# Patient Record
Sex: Male | Born: 1940 | Race: White | Hispanic: No | Marital: Married | Smoking: Former smoker
Health system: Southern US, Community
[De-identification: ages and names within clinical notes are randomized; demographics above are authoritative.]

## PROBLEM LIST (undated history)

## (undated) DIAGNOSIS — K219 Gastro-esophageal reflux disease without esophagitis: Secondary | ICD-10-CM

## (undated) DIAGNOSIS — N200 Calculus of kidney: Secondary | ICD-10-CM

## (undated) DIAGNOSIS — C801 Malignant (primary) neoplasm, unspecified: Secondary | ICD-10-CM

## (undated) DIAGNOSIS — H9190 Unspecified hearing loss, unspecified ear: Secondary | ICD-10-CM

## (undated) DIAGNOSIS — E7800 Pure hypercholesterolemia, unspecified: Secondary | ICD-10-CM

## (undated) DIAGNOSIS — Z950 Presence of cardiac pacemaker: Secondary | ICD-10-CM

## (undated) DIAGNOSIS — Z87442 Personal history of urinary calculi: Secondary | ICD-10-CM

## (undated) DIAGNOSIS — E119 Type 2 diabetes mellitus without complications: Secondary | ICD-10-CM

## (undated) DIAGNOSIS — I10 Essential (primary) hypertension: Secondary | ICD-10-CM

## (undated) DIAGNOSIS — J189 Pneumonia, unspecified organism: Secondary | ICD-10-CM

## (undated) DIAGNOSIS — R0600 Dyspnea, unspecified: Secondary | ICD-10-CM

## (undated) DIAGNOSIS — I219 Acute myocardial infarction, unspecified: Secondary | ICD-10-CM

## (undated) DIAGNOSIS — M1990 Unspecified osteoarthritis, unspecified site: Secondary | ICD-10-CM

## (undated) DIAGNOSIS — K760 Fatty (change of) liver, not elsewhere classified: Secondary | ICD-10-CM

## (undated) DIAGNOSIS — I2510 Atherosclerotic heart disease of native coronary artery without angina pectoris: Secondary | ICD-10-CM

## (undated) DIAGNOSIS — Z95810 Presence of automatic (implantable) cardiac defibrillator: Secondary | ICD-10-CM

## (undated) HISTORY — PX: REPLACEMENT TOTAL KNEE: SUR1224

## (undated) HISTORY — PX: INSERT / REPLACE / REMOVE PACEMAKER: SUR710

## (undated) HISTORY — DX: Gastro-esophageal reflux disease without esophagitis: K21.9

## (undated) HISTORY — DX: Type 2 diabetes mellitus without complications: E11.9

## (undated) HISTORY — PX: CORONARY ARTERY BYPASS GRAFT: SHX141

## (undated) HISTORY — PX: CARDIAC DEFIBRILLATOR PLACEMENT: SHX171

## (undated) HISTORY — DX: Pure hypercholesterolemia, unspecified: E78.00

## (undated) HISTORY — DX: Calculus of kidney: N20.0

## (undated) HISTORY — DX: Essential (primary) hypertension: I10

---

## 1966-01-05 HISTORY — PX: ANAL FISSURE REPAIR: SHX2312

## 1997-01-05 DIAGNOSIS — I219 Acute myocardial infarction, unspecified: Secondary | ICD-10-CM

## 1997-01-05 HISTORY — DX: Acute myocardial infarction, unspecified: I21.9

## 2015-01-06 HISTORY — PX: JOINT REPLACEMENT: SHX530

## 2016-05-26 ENCOUNTER — Encounter (INDEPENDENT_AMBULATORY_CARE_PROVIDER_SITE_OTHER): Payer: Self-pay | Admitting: Internal Medicine

## 2016-06-04 ENCOUNTER — Encounter (INDEPENDENT_AMBULATORY_CARE_PROVIDER_SITE_OTHER): Payer: Self-pay

## 2016-06-04 ENCOUNTER — Other Ambulatory Visit (INDEPENDENT_AMBULATORY_CARE_PROVIDER_SITE_OTHER): Payer: Self-pay | Admitting: Internal Medicine

## 2016-06-04 ENCOUNTER — Encounter (INDEPENDENT_AMBULATORY_CARE_PROVIDER_SITE_OTHER): Payer: Self-pay | Admitting: *Deleted

## 2016-06-04 ENCOUNTER — Ambulatory Visit (INDEPENDENT_AMBULATORY_CARE_PROVIDER_SITE_OTHER): Payer: Medicare Other | Admitting: Internal Medicine

## 2016-06-04 ENCOUNTER — Encounter (INDEPENDENT_AMBULATORY_CARE_PROVIDER_SITE_OTHER): Payer: Self-pay | Admitting: Internal Medicine

## 2016-06-04 DIAGNOSIS — K219 Gastro-esophageal reflux disease without esophagitis: Secondary | ICD-10-CM | POA: Diagnosis not present

## 2016-06-04 DIAGNOSIS — E7800 Pure hypercholesterolemia, unspecified: Secondary | ICD-10-CM | POA: Insufficient documentation

## 2016-06-04 DIAGNOSIS — E119 Type 2 diabetes mellitus without complications: Secondary | ICD-10-CM | POA: Insufficient documentation

## 2016-06-04 DIAGNOSIS — I10 Essential (primary) hypertension: Secondary | ICD-10-CM

## 2016-06-04 DIAGNOSIS — N200 Calculus of kidney: Secondary | ICD-10-CM | POA: Insufficient documentation

## 2016-06-04 HISTORY — DX: Calculus of kidney: N20.0

## 2016-06-04 HISTORY — DX: Gastro-esophageal reflux disease without esophagitis: K21.9

## 2016-06-04 HISTORY — DX: Essential (primary) hypertension: I10

## 2016-06-04 HISTORY — DX: Type 2 diabetes mellitus without complications: E11.9

## 2016-06-04 NOTE — Patient Instructions (Signed)
EGD. The risks of bleeding, perforation and infection were reviewed with patient.  

## 2016-06-04 NOTE — Progress Notes (Signed)
   Subjective:    Patient ID: Michael Key, male    DOB: Nov 10, 1940, 76 y.o.   MRN: [id number]  HPI Referred by Dr. Harmon Pier for reflux. Maintained on Protonix and Zantac. He says when he eats, three hours later he belches his food back up. He says he will belch the liquids up mainly. Symptoms for a long time.  No weight loss.  His symptoms occur every day.  He has a BM x 1 a day. No melena. Acid reflux controlled with Zantac and Protonix.  No dysphagia. Sometimes colas burns his esophagus.  Hx significant for DM, hypertension, cholesterol, and ischemic cardiomyopathy. , status post single chamber ICD   Last colonoscopy in February of 2016 by Dr. Earley Brooke Review of Systems Past Medical History:  Diagnosis Date  . Diabetes ([place]) 06/04/2016  . Essential hypertension, benign 06/04/2016  . GERD (gastroesophageal reflux disease) 06/04/2016  . Kidney stones 06/04/2016    Past Surgical History:  Procedure Laterality Date  . CARDIAC DEFIBRILLATOR PLACEMENT    . CORONARY ARTERY BYPASS GRAFT     04/2015    No Known Allergies  No current outpatient prescriptions on file prior to visit.   No current facility-administered medications on file prior to visit.    Current Outpatient Prescriptions  Medication Sig Dispense Refill  . acetaminophen (TYLENOL) 500 MG chewable tablet Chew 500 mg by mouth every 6 (six) hours as needed for pain.    Marland Kitchen aspirin EC 81 MG tablet Take 81 mg by mouth daily.    . carvedilol (COREG) 6.25 MG tablet Take 6.25 mg by mouth 2 (two) times daily with a meal.    . clopidogrel (PLAVIX) 75 MG tablet Take 75 mg by mouth daily.    . insulin glargine (LANTUS) 100 UNIT/ML injection Inject 45 Units into the skin 2 (two) times daily.    . insulin lispro (HUMALOG) 100 UNIT/ML injection Inject into the skin 3 (three) times daily before meals. Sliding scale 5010 units    . lisinopril (PRINIVIL,ZESTRIL) 10 MG tablet Take 10 mg by mouth daily.    . pantoprazole (PROTONIX) 40 MG  tablet Take 40 mg by mouth daily.    . ranitidine (ZANTAC) 150 MG capsule Take 150 mg by mouth 2 (two) times daily.    . rosuvastatin (CRESTOR) 20 MG tablet Take 20 mg by mouth daily.    . sitaGLIPtin-metformin (JANUMET) 50-1000 MG tablet Take 1 tablet by mouth 2 (two) times daily with a meal.    . tamsulosin (FLOMAX) 0.4 MG CAPS capsule Take 0.4 mg by mouth.     No current facility-administered medications for this visit.         Objective:   Physical Exam Blood pressure 130/60, pulse 64, temperature 97.7 F (36.5 C), height 6' (1.829 m), weight 218 lb 8 oz (99.1 kg).  Alert and oriented. Skin warm and dry. Oral mucosa is moist.   . Sclera anicteric, conjunctivae is pink. Thyroid not enlarged. No cervical lymphadenopathy. Lungs clear. Heart regular rate and rhythm.  Abdomen is soft. Bowel sounds are positive. No hepatomegaly. No abdominal masses felt. No tenderness.  No edema to lower extremities.  .        Assessment & Plan:  GERD. EGD. PUD needs to be ruled out. ? Hiatal hernia. The risks of bleeding, perforation and infection were reviewed with patient.

## 2016-06-05 ENCOUNTER — Encounter (INDEPENDENT_AMBULATORY_CARE_PROVIDER_SITE_OTHER): Payer: Self-pay

## 2016-06-05 ENCOUNTER — Telehealth (INDEPENDENT_AMBULATORY_CARE_PROVIDER_SITE_OTHER): Payer: Self-pay | Admitting: *Deleted

## 2016-06-05 NOTE — Telephone Encounter (Signed)
FYI: Per Dr Loel Lofty patient can hold Plavix 5 days prior to EGD sch'd 06/22/16 but he needs to continue his ASA -- patient's wife aware

## 2016-06-11 NOTE — Telephone Encounter (Signed)
Agree with continuing low-dose aspirin while Plavix on hold.

## 2016-06-15 ENCOUNTER — Encounter (INDEPENDENT_AMBULATORY_CARE_PROVIDER_SITE_OTHER): Payer: Self-pay

## 2016-06-22 ENCOUNTER — Encounter (HOSPITAL_COMMUNITY)
Admission: RE | Disposition: A | Discharge status: Home / Self Care | Payer: Self-pay | Source: Ambulatory Visit | Attending: Internal Medicine

## 2016-06-22 ENCOUNTER — Encounter (HOSPITAL_COMMUNITY): Payer: Self-pay | Admitting: *Deleted

## 2016-06-22 ENCOUNTER — Ambulatory Visit (HOSPITAL_COMMUNITY)
Admission: RE | Admit: 2016-06-22 | Discharge: 2016-06-22 | Disposition: A | Discharge status: Home / Self Care | Payer: Medicare Other | Source: Ambulatory Visit | Attending: Internal Medicine | Admitting: Internal Medicine

## 2016-06-22 DIAGNOSIS — Z7902 Long term (current) use of antithrombotics/antiplatelets: Secondary | ICD-10-CM | POA: Diagnosis not present

## 2016-06-22 DIAGNOSIS — Z950 Presence of cardiac pacemaker: Secondary | ICD-10-CM | POA: Insufficient documentation

## 2016-06-22 DIAGNOSIS — I10 Essential (primary) hypertension: Secondary | ICD-10-CM | POA: Diagnosis not present

## 2016-06-22 DIAGNOSIS — K317 Polyp of stomach and duodenum: Secondary | ICD-10-CM | POA: Insufficient documentation

## 2016-06-22 DIAGNOSIS — I2510 Atherosclerotic heart disease of native coronary artery without angina pectoris: Secondary | ICD-10-CM | POA: Diagnosis not present

## 2016-06-22 DIAGNOSIS — Z7982 Long term (current) use of aspirin: Secondary | ICD-10-CM | POA: Insufficient documentation

## 2016-06-22 DIAGNOSIS — E7800 Pure hypercholesterolemia, unspecified: Secondary | ICD-10-CM | POA: Insufficient documentation

## 2016-06-22 DIAGNOSIS — K219 Gastro-esophageal reflux disease without esophagitis: Secondary | ICD-10-CM | POA: Insufficient documentation

## 2016-06-22 DIAGNOSIS — Z794 Long term (current) use of insulin: Secondary | ICD-10-CM | POA: Diagnosis not present

## 2016-06-22 DIAGNOSIS — Z87891 Personal history of nicotine dependence: Secondary | ICD-10-CM | POA: Diagnosis not present

## 2016-06-22 DIAGNOSIS — K31819 Angiodysplasia of stomach and duodenum without bleeding: Secondary | ICD-10-CM | POA: Insufficient documentation

## 2016-06-22 DIAGNOSIS — I252 Old myocardial infarction: Secondary | ICD-10-CM | POA: Diagnosis not present

## 2016-06-22 DIAGNOSIS — R142 Eructation: Secondary | ICD-10-CM

## 2016-06-22 DIAGNOSIS — E119 Type 2 diabetes mellitus without complications: Secondary | ICD-10-CM | POA: Insufficient documentation

## 2016-06-22 DIAGNOSIS — Z951 Presence of aortocoronary bypass graft: Secondary | ICD-10-CM | POA: Diagnosis not present

## 2016-06-22 HISTORY — DX: Atherosclerotic heart disease of native coronary artery without angina pectoris: I25.10

## 2016-06-22 HISTORY — DX: Presence of automatic (implantable) cardiac defibrillator: Z95.810

## 2016-06-22 HISTORY — DX: Acute myocardial infarction, unspecified: I21.9

## 2016-06-22 HISTORY — PX: ESOPHAGOGASTRODUODENOSCOPY: SHX5428

## 2016-06-22 HISTORY — DX: Dyspnea, unspecified: R06.00

## 2016-06-22 HISTORY — PX: BIOPSY: SHX5522

## 2016-06-22 HISTORY — DX: Presence of cardiac pacemaker: Z95.0

## 2016-06-22 LAB — GLUCOSE, CAPILLARY
Glucose-Capillary: 146 mg/dL — ABNORMAL HIGH (ref 65–99)
Glucose-Capillary: 117 mg/dL — ABNORMAL HIGH (ref 65–99)

## 2016-06-22 SURGERY — EGD (ESOPHAGOGASTRODUODENOSCOPY)
Anesthesia: Moderate Sedation

## 2016-06-22 MED ORDER — MEPERIDINE HCL 50 MG/ML IJ SOLN
INTRAMUSCULAR | Status: AC
  Filled 2016-06-22: qty 1

## 2016-06-22 MED ORDER — MEPERIDINE HCL 50 MG/ML IJ SOLN
INTRAMUSCULAR | Status: DC | PRN
  Administered 2016-06-22 (×2): 25 mg via INTRAVENOUS

## 2016-06-22 MED ORDER — STERILE WATER FOR IRRIGATION IR SOLN
Status: DC | PRN
  Administered 2016-06-22: 08:00:00

## 2016-06-22 MED ORDER — MIDAZOLAM HCL 5 MG/5ML IJ SOLN
INTRAMUSCULAR | Status: AC
  Filled 2016-06-22: qty 10

## 2016-06-22 MED ORDER — SODIUM CHLORIDE 0.9 % IV SOLN
INTRAVENOUS | Status: DC
  Administered 2016-06-22: 1000 mL via INTRAVENOUS

## 2016-06-22 MED ORDER — LIDOCAINE VISCOUS 2 % MT SOLN
OROMUCOSAL | Status: AC
  Filled 2016-06-22: qty 15

## 2016-06-22 MED ORDER — LIDOCAINE VISCOUS 2 % MT SOLN
OROMUCOSAL | Status: DC | PRN
  Administered 2016-06-22: 4 mL via OROMUCOSAL

## 2016-06-22 MED ORDER — MIDAZOLAM HCL 5 MG/5ML IJ SOLN
INTRAMUSCULAR | Status: DC | PRN
  Administered 2016-06-22 (×2): 2 mg via INTRAVENOUS
  Administered 2016-06-22: 1 mg via INTRAVENOUS

## 2016-06-22 NOTE — Discharge Instructions (Signed)
Resume Plavix/clopidogrel on 06/23/2016. Take ranitidine or Zantac at bedtime and not twice daily. Resume other medications and diet as before. No driving for 24 hours. Physician will call with biopsy results. Solid-phase gastric emptying study to be scheduled. Office will call.  Esophagogastroduodenoscopy, Care After Refer to this sheet in the next few weeks. These instructions provide you with information about caring for yourself after your procedure. Your health care provider may also give you more specific instructions. Your treatment has been planned according to current medical practices, but problems sometimes occur. Call your health care provider if you have any problems or questions after your procedure. What can I expect after the procedure? After the procedure, it is common to have:  A sore throat.  Nausea.  Bloating.  Dizziness.  Fatigue.  Follow these instructions at home:  Do not eat or drink anything until the numbing medicine (local anesthetic) has worn off and your gag reflex has returned. You will know that the local anesthetic has worn off when you can swallow comfortably.  Do not drive for 24 hours if you received a medicine to help you relax (sedative).  If your health care provider took a tissue sample for testing during the procedure, make sure to get your test results. This is your responsibility. Ask your health care provider or the department performing the test when your results will be ready.  Keep all follow-up visits as told by your health care provider. This is important. Contact a health care provider if:  You cannot stop coughing.  You are not urinating.  You are urinating less than usual. Get help right away if:  You have trouble swallowing.  You cannot eat or drink.  You have throat or chest pain that gets worse.  You are dizzy or light-headed.  You faint.  You have nausea or vomiting.  You have chills.  You have a  fever.  You have severe abdominal pain.  You have black, tarry, or bloody stools. This information is not intended to replace advice given to you by your health care provider. Make sure you discuss any questions you have with your health care provider. Document Released: 12/09/2011 Document Revised: 05/30/2015 Document Reviewed: 11/15/2014 Elsevier Interactive Patient Education  2018 [employer].  Ranitidine tablets or capsules What is this medicine? RANITIDINE (ra NYE te deen) is a type of antihistamine that blocks the release of stomach acid. It is used to treat stomach or intestinal ulcers. It can relieve ulcer pain and discomfort, and the heartburn from acid reflux. This medicine may be used for other purposes; ask your health care provider or pharmacist if you have questions. COMMON BRAND NAME(S): Acid Reducer, Ranitidine, Taladine, Wal-Zan, Zantac, Zantac 150, Zantac 75 What should I tell my health care provider before I take this medicine? They need to know if you have any of these conditions: -kidney disease -liver disease -porphyria -an unusual or allergic reaction to ranitidine, other medicines, foods, dyes, or preservatives -pregnant or trying to get pregnant -breast-feeding How should I use this medicine? Take this medicine by mouth with a glass of water. Follow the directions on the prescription label. If you only take this medicine once a day, take it at bedtime. Take your medicine at regular intervals. Do not take your medicine more often than directed. Do not stop taking except on your doctor's advice. Talk to your pediatrician regarding the use of this medicine in children. Special care may be needed. Overdosage: If you think you have taken  too much of this medicine contact a poison control center or emergency room at once. NOTE: This medicine is only for you. Do not share this medicine with others. What if I miss a dose? If you miss a dose, take it as soon as you can.  If it is almost time for your next dose, take only that dose. Do not take double or extra doses. What may interact with this medicine? -atazanavir -delavirdine -gefitinib -glipizide -ketoconazole -midazolam -procainamide -propantheline -triazolam -warfarin This list may not describe all possible interactions. Give your health care provider a list of all the medicines, herbs, non-prescription drugs, or dietary supplements you use. Also tell them if you smoke, drink alcohol, or use illegal drugs. Some items may interact with your medicine. What should I watch for while using this medicine? Tell your doctor or health care professional if your condition does not start to get better or gets worse. You may need to take this medicine for several days as prescribed before your symptoms get better. Finish the full course of tablets prescribed, even if you feel better. Do not smoke cigarettes or drink alcohol. These increase irritation in your stomach and can lengthen the time it will take for ulcers to heal. Cigarettes and alcohol can also make acid reflux or heartburn worse. If you get black, tarry stools or vomit up what looks like coffee grounds, call your doctor or health care professional at once. You may have a bleeding ulcer. What side effects may I notice from receiving this medicine? Side effects that you should report to your doctor or health care professional as soon as possible: -agitation, nervousness, depression, hallucinations -allergic reactions like skin rash, itching or hives, swelling of the face, lips, or tongue -breast enlargement in both males and females -breathing problems -redness, blistering, peeling or loosening of the skin, including inside the mouth -unusual bleeding or bruising -unusually weak or tired -vomiting -yellowing of the skin or eyes Side effects that usually do not require medical attention (report to your doctor or health care professional if they continue  or are bothersome): -constipation or diarrhea -dizziness -headache -nausea This list may not describe all possible side effects. Call your doctor for medical advice about side effects. You may report side effects to FDA at 1-800-FDA-1088. Where should I keep my medicine? Keep out of the reach of children. Store at room temperature between 15 and 30 degrees C (59 and 86 degrees F). Protect from light and moisture. Keep container tightly closed. Throw away any unused medicine after the expiration date. NOTE: This sheet is a summary. It may not cover all possible information. If you have questions about this medicine, talk to your doctor, pharmacist, or health care provider.  2018 Elsevier/Gold Standard (2012-04-13 14:50:34)

## 2016-06-22 NOTE — Op Note (Signed)
[facility] Patient Name: Michael Key Procedure Date: 06/22/2016 7:03 AM MRN: [id number] Date of Birth: [birth year] Attending MD: Hildred Laser , MD CSN: [id number] Age: 76 Admit Type: Outpatient Procedure:                Upper GI endoscopy Indications:              Follow-up of gastro-esophageal reflux disease,                            postprandial belching Providers:                Hildred Laser, MD, Otis Peak B. Sharon Seller, RN, Aram Candela Referring MD:             Cherly Anderson. Pomposini, MD Medicines:                Lidocaine spray, Meperidine 50 mg IV, Midazolam 5                            mg IV Complications:            No immediate complications. Estimated Blood Loss:     Estimated blood loss was minimal. Procedure:                Pre-Anesthesia Assessment:                           - Prior to the procedure, a History and Physical                            was performed, and patient medications and                            allergies were reviewed. The patient's tolerance of                            previous anesthesia was also reviewed. The risks                            and benefits of the procedure and the sedation                            options and risks were discussed with the patient.                            All questions were answered, and informed consent                            was obtained. Prior Anticoagulants: The patient                            last took aspirin 1 day and Plavix (clopidogrel) 5  days prior to the procedure. ASA Grade Assessment:                            III - A patient with severe systemic disease. After                            reviewing the risks and benefits, the patient was                            deemed in satisfactory condition to undergo the                            procedure.                           After obtaining informed consent, the endoscope was                           passed under direct vision. Throughout the                            procedure, the patient's blood pressure, pulse, and                            oxygen saturations were monitored continuously. The                            EG-[id number] ([id number]) scope was introduced through the                            mouth, and advanced to the second part of duodenum.                            The upper GI endoscopy was accomplished without                            difficulty. The patient tolerated the procedure                            well. Scope In: 7:45:10 AM Scope Out: 7:53:44 AM Total Procedure Duration: 0 hours 8 minutes 34 seconds  Findings:      Two small cysts located along the right wall of hypopharynx.      The examined esophagus was normal.      The Z-line was regular and was found 40 cm from the incisors.      Multiple 3 to 8 mm pedunculated and sessile polyps were found in the       gastric body. Four polyps were biopsied for histology. The pathology       specimen was placed into Bottle Number 1.      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      The exam of the stomach was otherwise normal.      The duodenal bulb and  second portion of the duodenum were normal. Impression:               - Two small cysts located along the rightwall of                            hypopharynx.                           - Normal esophagus.                           - Z-line regular, 40 cm from the incisors.                           - Multiple gastric polyps. Biopsied.                           - Gastric antral vascular ectasia without bleeding.                           - Normal duodenal bulb and second portion of the                            duodenum. Moderate Sedation:      Moderate (conscious) sedation was administered by the endoscopy nurse       and supervised by the  endoscopist. The following parameters were       monitored: oxygen saturation, heart rate, blood pressure, CO2       capnography and response to care. Total physician intraservice time was       13 minutes. Recommendation:           - Patient has a contact number available for                            emergencies. The signs and symptoms of potential                            delayed complications were discussed with the                            patient. Return to normal activities tomorrow.                            Written discharge instructions were provided to the                            patient.                           - Patient has a contact number available for                            emergencies. The signs and symptoms of potential                            delayed complications were discussed with  the                            patient. Return to normal activities tomorrow.                            Written discharge instructions were provided to the                            patient.                           - Resume previous diet today.                           - Continue present medications.                           - Resume Plavix (clopidogrel) at prior dose                            tomorrow.                           - Await pathology results. Procedure Code(s):        --- Professional ---                           [phone], Esophagogastroduodenoscopy, flexible,                            transoral; with biopsy, single or multiple                           99152, Moderate sedation services provided by the                            same physician or other qualified health care                            professional performing the diagnostic or                            therapeutic service that the sedation supports,                            requiring the presence of an independent trained                            observer to assist in the monitoring  of the                            patient's level of consciousness and physiological                            status; initial 15 minutes of intraservice time,  patient age 76 years or older Diagnosis Code(s):        --- Professional ---                           K31.7, Polyp of stomach and duodenum                           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 All rights reserved. The codes documented in this report are preliminary and upon coder review may  be revised to meet current compliance requirements. Hildred Laser, MD Hildred Laser, MD 06/22/2016 8:11:13 AM This report has been signed electronically. Number of Addenda: 0

## 2016-06-22 NOTE — H&P (Signed)
Michael Key is an 76 y.o. male.   Chief Complaint: Patient is here for EGD. HPI: Patient is 76 year old Caucasian male who has several year history of GERD and has been on PPI. Patient states ever since his PPI was changed because of high co-pay he's been having problems. He belches for 3-4 hours after each meal. However he has not experienced nausea vomiting abdominal pain. He states heartburns well controlled with therapy. He has good appetite and denies weight loss or melena. He states he is never undergone EGD before. Patient has been off Plavix for 5 days. He remains on aspirin. He has been diabetic for 19 years.  Past Medical History:  Diagnosis Date  . AICD (automatic cardioverter/defibrillator) present   . Coronary artery disease   . Diabetes ([place]) 06/04/2016  . Dyspnea   . Essential hypertension, benign 06/04/2016  . GERD (gastroesophageal reflux disease) 06/04/2016  . High cholesterol   . Kidney stones 06/04/2016  . Myocardial infarction ([place]) 1999  . Presence of permanent cardiac pacemaker     Past Surgical History:  Procedure Laterality Date  . ANAL FISSURE REPAIR  1968  . CARDIAC DEFIBRILLATOR PLACEMENT    . CORONARY ARTERY BYPASS GRAFT     04/2015  . INSERT / REPLACE / REMOVE PACEMAKER    . JOINT REPLACEMENT Left 2017  . REPLACEMENT TOTAL KNEE     left 2017    History reviewed. No pertinent family history. Social History:  reports that he quit smoking about 19 years ago. He has never used smokeless tobacco. He reports that he does not drink alcohol or use drugs.  Allergies: No Known Allergies  Medications Prior to Admission  Medication Sig Dispense Refill  . acetaminophen (TYLENOL) 500 MG chewable tablet Chew 500 mg by mouth every 6 (six) hours as needed for pain.    Marland Kitchen aspirin EC 81 MG tablet Take 81 mg by mouth daily.    . carvedilol (COREG) 6.25 MG tablet Take 6.25 mg by mouth 2 (two) times daily with a meal.    . clopidogrel (PLAVIX) 75 MG tablet Take 75 mg by  mouth daily.    . insulin glargine (LANTUS) 100 UNIT/ML injection Inject 45 Units into the skin 2 (two) times daily.    . insulin lispro (HUMALOG) 100 UNIT/ML injection Inject into the skin 3 (three) times daily before meals. Sliding scale 5010 units    . lisinopril (PRINIVIL,ZESTRIL) 10 MG tablet Take 10 mg by mouth daily.    . pantoprazole (PROTONIX) 40 MG tablet Take 40 mg by mouth daily.    . ranitidine (ZANTAC) 150 MG capsule Take 150 mg by mouth 2 (two) times daily.    . rosuvastatin (CRESTOR) 20 MG tablet Take 20 mg by mouth daily.    . sitaGLIPtin-metformin (JANUMET) 50-1000 MG tablet Take 1 tablet by mouth 2 (two) times daily with a meal.    . tamsulosin (FLOMAX) 0.4 MG CAPS capsule Take 0.4 mg by mouth daily after supper.       Results for orders placed or performed during the hospital encounter of 06/22/16 (from the past 48 hour(s))  Glucose, capillary     Status: Abnormal   Collection Time: 06/22/16  7:02 AM  Result Value Ref Range   Glucose-Capillary 146 (H) 65 - 99 mg/dL   No results found.  ROS  Blood pressure 125/65, pulse 78, temperature 97.8 F (36.6 C), temperature source Oral, resp. rate 17, height 6' (1.829 m), weight 214 lb (97.1 kg), SpO2 97 %.  Physical Exam  Constitutional: He appears well-developed and well-nourished.  HENT:  Mouth/Throat: Oropharynx is clear and moist.  Eyes: Conjunctivae are normal. No scleral icterus.  Neck: No thyromegaly present.  Cardiovascular: Normal rate, regular rhythm and normal heart sounds.   No murmur heard. Respiratory: Effort normal and breath sounds normal.  Patient has pacemaker in left pectoral region.  GI:  Abdomen is full but soft and nontender without organomegaly or masses.  Musculoskeletal: He exhibits no edema.  Lymphadenopathy:    He has no cervical adenopathy.  Neurological: He is alert.  Skin: Skin is warm and dry.     Assessment/Plan Chronic GERD. Persistent postprandial belching. Diagnostic  EGD.  Hildred Laser, MD 06/22/2016, 7:34 AM

## 2016-06-24 ENCOUNTER — Other Ambulatory Visit (INDEPENDENT_AMBULATORY_CARE_PROVIDER_SITE_OTHER): Payer: Self-pay | Admitting: *Deleted

## 2016-06-24 ENCOUNTER — Telehealth (INDEPENDENT_AMBULATORY_CARE_PROVIDER_SITE_OTHER): Payer: Self-pay | Admitting: *Deleted

## 2016-06-24 ENCOUNTER — Encounter (INDEPENDENT_AMBULATORY_CARE_PROVIDER_SITE_OTHER): Payer: Self-pay | Admitting: *Deleted

## 2016-06-24 NOTE — Progress Notes (Signed)
.  J

## 2016-06-24 NOTE — Telephone Encounter (Signed)
Michael Key from [place] called--Dr. Laural Golden would like patient to have solid phase GES   Persistent postprandial belching. S/P EGD   Tammy can you put order in please and Lelon Frohlich can schedule upon return

## 2016-06-25 ENCOUNTER — Encounter (HOSPITAL_COMMUNITY): Payer: Self-pay | Admitting: Internal Medicine

## 2016-06-25 NOTE — Telephone Encounter (Signed)
Awaiting for PA to be done. Then order to be placed.

## 2016-06-29 ENCOUNTER — Other Ambulatory Visit (INDEPENDENT_AMBULATORY_CARE_PROVIDER_SITE_OTHER): Payer: Self-pay | Admitting: Internal Medicine

## 2016-06-29 DIAGNOSIS — R142 Eructation: Secondary | ICD-10-CM

## 2016-06-29 NOTE — Telephone Encounter (Signed)
GES sh'd 07/14/16 at 800 am (745), npo after midnight, no stomach meds after midnight, left detailed message for patient

## 2016-06-29 NOTE — Telephone Encounter (Signed)
Michael Key - this may need to have PA completed before order can be placed.

## 2016-07-14 ENCOUNTER — Encounter (HOSPITAL_COMMUNITY): Payer: Self-pay

## 2016-07-14 ENCOUNTER — Encounter (HOSPITAL_COMMUNITY)
Admission: RE | Admit: 2016-07-14 | Discharge: 2016-07-14 | Disposition: A | Discharge status: Home / Self Care | Payer: Medicare Other | Source: Ambulatory Visit | Attending: Internal Medicine | Admitting: Internal Medicine

## 2016-07-14 DIAGNOSIS — R142 Eructation: Secondary | ICD-10-CM | POA: Diagnosis present

## 2016-07-14 MED ORDER — TECHNETIUM TC 99M SULFUR COLLOID
2.0000 | Freq: Once | INTRAVENOUS | Status: AC | PRN
  Administered 2016-07-14: 2.1 via ORAL

## 2016-07-20 ENCOUNTER — Other Ambulatory Visit (INDEPENDENT_AMBULATORY_CARE_PROVIDER_SITE_OTHER): Payer: Self-pay | Admitting: Internal Medicine

## 2016-07-20 DIAGNOSIS — R142 Eructation: Secondary | ICD-10-CM

## 2016-07-23 ENCOUNTER — Ambulatory Visit (HOSPITAL_COMMUNITY): Payer: Medicare Other

## 2016-07-27 ENCOUNTER — Ambulatory Visit (HOSPITAL_COMMUNITY)
Admission: RE | Admit: 2016-07-27 | Discharge: 2016-07-27 | Disposition: A | Discharge status: Home / Self Care | Payer: Medicare Other | Source: Ambulatory Visit | Attending: Internal Medicine | Admitting: Internal Medicine

## 2016-07-27 DIAGNOSIS — R142 Eructation: Secondary | ICD-10-CM | POA: Insufficient documentation

## 2016-08-06 ENCOUNTER — Other Ambulatory Visit (INDEPENDENT_AMBULATORY_CARE_PROVIDER_SITE_OTHER): Payer: Self-pay | Admitting: Internal Medicine

## 2016-08-06 MED ORDER — METRONIDAZOLE 250 MG PO TABS: 250.0000 mg | ORAL_TABLET | Freq: Three times a day (TID) | ORAL | 0 refills | Status: DC

## 2017-10-04 ENCOUNTER — Other Ambulatory Visit: Payer: Self-pay | Admitting: Urology

## 2017-10-04 DIAGNOSIS — R310 Gross hematuria: Secondary | ICD-10-CM

## 2017-10-18 ENCOUNTER — Other Ambulatory Visit: Payer: Self-pay | Admitting: Urology

## 2017-10-20 NOTE — Patient Instructions (Signed)
Michael Key  10/20/2017     @PREFPERIOPPHARMACY @   Your procedure is scheduled on 10/26/2017  Report to [facility] at 615 A.M.  Call this number if you have problems the morning of surgery:  [phone]   Remember:  Do not eat or drink after midnight.      Take these medicines the morning of surgery with A SIP OF WATER Coreg, Xyzal, Lisinopril, Protonix, Zantac, Flomax              NO Janumet or AM insulin - Take 1/2 evening insulin dose evening prior.   Do not wear jewelry, make-up or nail polish.  Do not wear lotions, powders, or perfumes, or deodorant.  Do not shave 48 hours prior to surgery.  Men may shave face and neck.  Do not bring valuables to the hospital.  [facility] is not responsible for any belongings or valuables.  Contacts, dentures or bridgework may not be worn into surgery.  Leave your suitcase in the car.  After surgery it may be brought to your room.  For patients admitted to the hospital, discharge time will be determined by your treatment team.  Patients discharged the day of surgery will not be allowed to drive home.    Please read over the following fact sheets that you were given. Surgical Site Infection Prevention and Anesthesia Post-op Instructions     PATIENT INSTRUCTIONS POST-ANESTHESIA  IMMEDIATELY FOLLOWING SURGERY:  Do not drive or operate machinery for the first twenty four hours after surgery.  Do not make any important decisions for twenty four hours after surgery or while taking narcotic pain medications or sedatives.  If you develop intractable nausea and vomiting or a severe headache please notify your doctor immediately.  FOLLOW-UP:  Please make an appointment with your surgeon as instructed. You do not need to follow up with anesthesia unless specifically instructed to do so.  WOUND CARE INSTRUCTIONS (if applicable):  Keep a dry clean dressing on the anesthesia/puncture wound site if there is drainage.  Once the wound has quit  draining you may leave it open to air.  Generally you should leave the bandage intact for twenty four hours unless there is drainage.  If the epidural site drains for more than 36-48 hours please call the anesthesia department.  QUESTIONS?:  Please feel free to call your physician or the hospital operator if you have any questions, and they will be happy to assist you.      Transurethral Resection of Bladder Tumor Transurethral resection of a bladder tumor is the removal (resection) of a cancerous growth (tumor) on the inside wall of the bladder. The bladder is the organ that holds urine. The tumor is removed through the tube that carries urine out of the body (urethra). In a transurethral resection, a thin telescope with a light, a tiny camera, and an electric cutting edge (resectoscope) is passed through the urethra. In men, the opening of the urethra is at the end of the penis. In women, it is just above the vaginal opening. Tell a health care provider about:  Any allergies you have.  All medicines you are taking, including vitamins, herbs, eye drops, creams, and over-the-counter medicines.  Any problems you or family members have had with anesthetic medicines.  Any blood disorders you have.  Any surgeries you have had.  Any medical conditions you have.  Any recent urinary tract infections you have had.  Whether you are pregnant or may be pregnant. What are  the risks? Generally, this is a safe procedure. However, problems may occur, including:  Infection.  Bleeding.  Allergic reactions to medicines.  Damage to other structures or organs, such as: ? The urethra. ? The tubes that drain urine from the kidneys into the bladder (ureters).  Pain and burning during urination.  Difficulty urinating due to partial blockage of the urethra.  Inability to urinate (urinary retention).  What happens before the procedure?  Follow instructions from your health care provider about  eating and drinking restrictions.  Ask your health care provider about: ? Changing or stopping your regular medicines. This is especially important if you are taking diabetes medicines or blood thinners. ? Taking medicines such as aspirin and ibuprofen. These medicines can thin your blood. Do not take these medicines before your procedure if your health care provider instructs you not to.  You may have a physical exam.  You may have tests, including: ? Blood tests. ? Urine tests. ? Electrocardiogram (ECG). This test measures the electrical activity of the heart.  You may be given antibiotic medicine to help prevent infection.  Ask your health care provider how your surgical site will be marked or identified.  Plan to have someone take you home after the procedure. What happens during the procedure?  To reduce your risk of infection: ? Your health care team will wash or sanitize their hands. ? Your skin will be washed with soap.  An IV tube will be inserted into one of your veins.  You will be given one or more of the following: ? A medicine to help you relax (sedative). ? A medicine to make you fall asleep (general anesthetic). ? A medicine that is injected into your spine to numb the area below and slightly above the injection site (spinal anesthetic).  Your legs will be placed in foot rests (stirrups) so that your legs are apart and your knees are bent.  The resectoscope will be passed through your urethra and into your bladder.  The part of your bladder that is affected by the tumor will be resected using the cutting edge of the resectoscope.  The resectoscope will be removed.  A thin, flexible tube (catheter) will be passed through your urethra and into your bladder. The catheter will drain urine into a bag outside of your body. ? Fluid may be passed through the catheter to keep the catheter open. The procedure may vary among health care providers and hospitals. What  happens after the procedure?  Your blood pressure, heart rate, breathing rate, and blood oxygen level will be monitored often until the medicines you were given have worn off.  You may continue to receive fluids and medicines through an IV tube.  You will have some pain. Pain medicine will be available to help you.  You will have a catheter draining your urine. ? You will have blood in your urine. Your catheter may be kept in until your urine is clear. ? Your urinary drainage will be monitored. If necessary, your bladder may be rinsed out (irrigated) by passing fluid through your catheter.  You will be encouraged to walk around as soon as possible.  You may have to wear compression stockings. These stockings help to prevent blood clots and reduce swelling in your legs.  Do not drive for 24 hours if you received a sedative. This information is not intended to replace advice given to you by your health care provider. Make sure you discuss any questions you have with  your health care provider. Document Released: 10/18/2008 Document Revised: 08/25/2015 Document Reviewed: 09/13/2014 Elsevier Interactive Patient Education  2018 [employer].

## 2017-10-21 ENCOUNTER — Encounter (HOSPITAL_COMMUNITY): Payer: Self-pay

## 2017-10-21 ENCOUNTER — Encounter (HOSPITAL_COMMUNITY)
Admission: RE | Admit: 2017-10-21 | Discharge: 2017-10-21 | Disposition: A | Discharge status: Home / Self Care | Payer: Medicare Other | Source: Ambulatory Visit | Attending: Urology | Admitting: Urology

## 2017-10-21 ENCOUNTER — Other Ambulatory Visit: Payer: Self-pay

## 2017-10-21 DIAGNOSIS — E119 Type 2 diabetes mellitus without complications: Secondary | ICD-10-CM | POA: Diagnosis not present

## 2017-10-21 DIAGNOSIS — Z01818 Encounter for other preprocedural examination: Secondary | ICD-10-CM | POA: Insufficient documentation

## 2017-10-21 HISTORY — DX: Personal history of urinary calculi: Z87.442

## 2017-10-21 HISTORY — DX: Unspecified hearing loss, unspecified ear: H91.90

## 2017-10-21 HISTORY — DX: Unspecified osteoarthritis, unspecified site: M19.90

## 2017-10-21 LAB — CBC
HEMATOCRIT: 40.4 % (ref 39.0–52.0)
HEMOGLOBIN: 13.6 g/dL (ref 13.0–17.0)
MCH: 30 pg (ref 26.0–34.0)
MCHC: 33.7 g/dL (ref 30.0–36.0)
MCV: 89.2 fL (ref 80.0–100.0)
Platelets: 197 10*3/uL (ref 150–400)
RBC: 4.53 MIL/uL (ref 4.22–5.81)
RDW: 13.1 % (ref 11.5–15.5)
WBC: 9.3 10*3/uL (ref 4.0–10.5)
nRBC: 0 % (ref 0.0–0.2)

## 2017-10-21 LAB — BASIC METABOLIC PANEL
ANION GAP: 8 (ref 5–15)
BUN: 14 mg/dL (ref 8–23)
CHLORIDE: 105 mmol/L (ref 98–111)
CO2: 26 mmol/L (ref 22–32)
Calcium: 9 mg/dL (ref 8.9–10.3)
Creatinine, Ser: 0.93 mg/dL (ref 0.61–1.24)
GFR calc non Af Amer: >60 mL/min (ref >60)
Glucose, Bld: 121 mg/dL — ABNORMAL HIGH (ref 70–99)
Potassium: 3.8 mmol/L (ref 3.5–5.1)
Sodium: 139 mmol/L (ref 135–145)

## 2017-10-21 LAB — HEMOGLOBIN A1C
HEMOGLOBIN A1C: 6.9 % — AB (ref 4.8–5.6)
MEAN PLASMA GLUCOSE: 151.33 mg/dL

## 2017-10-21 LAB — GLUCOSE, CAPILLARY: GLUCOSE-CAPILLARY: 126 mg/dL — AB (ref 70–99)

## 2017-10-22 NOTE — Pre-Procedure Instructions (Signed)
HgbA1C routed to PCP. 

## 2017-10-25 MED ORDER — GEMCITABINE CHEMO FOR BLADDER INSTILLATION 2000 MG
2000.0000 mg | Freq: Once | INTRAVENOUS | Status: DC
  Filled 2017-10-25 (×2): qty 52.6

## 2017-10-25 NOTE — Progress Notes (Signed)
Attempted to obtain ICD management perioperative prescription from Dr Veleta Miners via fax and multiple phone calls.  Evonnie Pat Clinical nurse Specialist for Surgical services notified and  "stated that patients having procedures below umbilicus need no intervention".  Tomi Bamberger the [employer] Rep stated "no intervention needed for below the umbilicus".  Dr. Hilaria Ota notified we had not received the order from Dr Loel Lofty.

## 2017-10-26 ENCOUNTER — Encounter (HOSPITAL_COMMUNITY)
Admission: RE | Disposition: A | Discharge status: Home / Self Care | Payer: Self-pay | Source: Ambulatory Visit | Attending: Urology

## 2017-10-26 ENCOUNTER — Encounter (HOSPITAL_COMMUNITY): Payer: Self-pay | Admitting: *Deleted

## 2017-10-26 ENCOUNTER — Ambulatory Visit (HOSPITAL_COMMUNITY): Payer: Medicare Other | Admitting: Anesthesiology

## 2017-10-26 ENCOUNTER — Ambulatory Visit (HOSPITAL_COMMUNITY)
Admission: RE | Admit: 2017-10-26 | Discharge: 2017-10-26 | Disposition: A | Discharge status: Home / Self Care | Payer: Medicare Other | Source: Ambulatory Visit | Attending: Urology | Admitting: Urology

## 2017-10-26 DIAGNOSIS — I2510 Atherosclerotic heart disease of native coronary artery without angina pectoris: Secondary | ICD-10-CM | POA: Insufficient documentation

## 2017-10-26 DIAGNOSIS — Z79899 Other long term (current) drug therapy: Secondary | ICD-10-CM | POA: Diagnosis not present

## 2017-10-26 DIAGNOSIS — N3081 Other cystitis with hematuria: Secondary | ICD-10-CM | POA: Insufficient documentation

## 2017-10-26 DIAGNOSIS — Z794 Long term (current) use of insulin: Secondary | ICD-10-CM | POA: Diagnosis not present

## 2017-10-26 DIAGNOSIS — I10 Essential (primary) hypertension: Secondary | ICD-10-CM | POA: Diagnosis not present

## 2017-10-26 DIAGNOSIS — K219 Gastro-esophageal reflux disease without esophagitis: Secondary | ICD-10-CM | POA: Diagnosis not present

## 2017-10-26 DIAGNOSIS — Z95810 Presence of automatic (implantable) cardiac defibrillator: Secondary | ICD-10-CM | POA: Insufficient documentation

## 2017-10-26 DIAGNOSIS — Z96652 Presence of left artificial knee joint: Secondary | ICD-10-CM | POA: Insufficient documentation

## 2017-10-26 DIAGNOSIS — D090 Carcinoma in situ of bladder: Secondary | ICD-10-CM | POA: Diagnosis not present

## 2017-10-26 DIAGNOSIS — Z87891 Personal history of nicotine dependence: Secondary | ICD-10-CM | POA: Insufficient documentation

## 2017-10-26 DIAGNOSIS — I252 Old myocardial infarction: Secondary | ICD-10-CM | POA: Diagnosis not present

## 2017-10-26 DIAGNOSIS — Z951 Presence of aortocoronary bypass graft: Secondary | ICD-10-CM | POA: Insufficient documentation

## 2017-10-26 DIAGNOSIS — R319 Hematuria, unspecified: Secondary | ICD-10-CM | POA: Diagnosis present

## 2017-10-26 DIAGNOSIS — D494 Neoplasm of unspecified behavior of bladder: Secondary | ICD-10-CM

## 2017-10-26 DIAGNOSIS — E109 Type 1 diabetes mellitus without complications: Secondary | ICD-10-CM | POA: Diagnosis not present

## 2017-10-26 HISTORY — PX: TRANSURETHRAL RESECTION OF BLADDER TUMOR: SHX2575

## 2017-10-26 LAB — GLUCOSE, CAPILLARY
Glucose-Capillary: 127 mg/dL — ABNORMAL HIGH (ref 70–99)
Glucose-Capillary: 109 mg/dL — ABNORMAL HIGH (ref 70–99)

## 2017-10-26 SURGERY — TURBT (TRANSURETHRAL RESECTION OF BLADDER TUMOR)
Anesthesia: General

## 2017-10-26 MED ORDER — MIDAZOLAM HCL 2 MG/2ML IJ SOLN: 0.5000 mg | Freq: Once | INTRAMUSCULAR | Status: DC | PRN

## 2017-10-26 MED ORDER — TRAMADOL HCL 50 MG PO TABS: 50.0000 mg | ORAL_TABLET | Freq: Four times a day (QID) | ORAL | 0 refills | Status: AC | PRN

## 2017-10-26 MED ORDER — FENTANYL CITRATE (PF) 100 MCG/2ML IJ SOLN
INTRAMUSCULAR | Status: DC | PRN
  Administered 2017-10-26 (×2): 25 ug via INTRAVENOUS

## 2017-10-26 MED ORDER — CEFAZOLIN SODIUM-DEXTROSE 2-4 GM/100ML-% IV SOLN
INTRAVENOUS | Status: AC
  Filled 2017-10-26: qty 100

## 2017-10-26 MED ORDER — PROPOFOL 10 MG/ML IV BOLUS
INTRAVENOUS | Status: AC
  Filled 2017-10-26: qty 40

## 2017-10-26 MED ORDER — PHENYLEPHRINE 40 MCG/ML (10ML) SYRINGE FOR IV PUSH (FOR BLOOD PRESSURE SUPPORT)
PREFILLED_SYRINGE | INTRAVENOUS | Status: AC
  Filled 2017-10-26: qty 10

## 2017-10-26 MED ORDER — SEVOFLURANE IN SOLN
RESPIRATORY_TRACT | Status: AC
  Filled 2017-10-26: qty 250

## 2017-10-26 MED ORDER — PROPOFOL 10 MG/ML IV BOLUS
INTRAVENOUS | Status: DC | PRN
  Administered 2017-10-26: 30 mg via INTRAVENOUS
  Administered 2017-10-26: 100 mg via INTRAVENOUS

## 2017-10-26 MED ORDER — STERILE WATER FOR IRRIGATION IR SOLN
Status: DC | PRN
  Administered 2017-10-26: 3000 mL
  Administered 2017-10-26: 1000 mL

## 2017-10-26 MED ORDER — MIDAZOLAM HCL 5 MG/5ML IJ SOLN
INTRAMUSCULAR | Status: DC | PRN
  Administered 2017-10-26 (×2): 1 mg via INTRAVENOUS

## 2017-10-26 MED ORDER — CEFAZOLIN SODIUM-DEXTROSE 2-3 GM-%(50ML) IV SOLR
INTRAVENOUS | Status: DC | PRN
  Administered 2017-10-26: 2 g via INTRAVENOUS

## 2017-10-26 MED ORDER — MIDAZOLAM HCL 2 MG/2ML IJ SOLN
INTRAMUSCULAR | Status: AC
  Filled 2017-10-26: qty 2

## 2017-10-26 MED ORDER — HYDROMORPHONE HCL 1 MG/ML IJ SOLN
0.2500 mg | INTRAMUSCULAR | Status: DC | PRN
  Administered 2017-10-26: 0.5 mg via INTRAVENOUS
  Filled 2017-10-26: qty 0.5

## 2017-10-26 MED ORDER — LACTATED RINGERS IV SOLN
INTRAVENOUS | Status: DC
  Administered 2017-10-26 (×2): via INTRAVENOUS

## 2017-10-26 MED ORDER — PROMETHAZINE HCL 25 MG/ML IJ SOLN: 6.2500 mg | INTRAMUSCULAR | Status: DC | PRN

## 2017-10-26 MED ORDER — HYDROCODONE-ACETAMINOPHEN 7.5-325 MG PO TABS: 1.0000 | ORAL_TABLET | Freq: Once | ORAL | Status: DC | PRN

## 2017-10-26 MED ORDER — FENTANYL CITRATE (PF) 100 MCG/2ML IJ SOLN
INTRAMUSCULAR | Status: AC
  Filled 2017-10-26: qty 2

## 2017-10-26 SURGICAL SUPPLY — 24 items
BAG DRAIN URO TABLE W/ADPT NS (BAG) ×2 IMPLANT
BAG URINE LEG 500ML (DRAIN) ×2 IMPLANT
CATH FOLEY 2WAY SLVR  5CC 20FR (CATHETERS) ×1
CATH FOLEY 2WAY SLVR 5CC 20FR (CATHETERS) ×1 IMPLANT
CLOTH BEACON ORANGE TIMEOUT ST (SAFETY) ×2 IMPLANT
ELECT REM PT RETURN 9FT ADLT (ELECTROSURGICAL) ×2
ELECTRODE REM PT RTRN 9FT ADLT (ELECTROSURGICAL) ×1 IMPLANT
GLOVE BIO SURGEON STRL SZ8 (GLOVE) ×2 IMPLANT
GLOVE BIOGEL PI IND STRL 7.0 (GLOVE) ×2 IMPLANT
GLOVE BIOGEL PI IND STRL 7.5 (GLOVE) ×2 IMPLANT
GLOVE BIOGEL PI INDICATOR 7.0 (GLOVE) ×2
GLOVE BIOGEL PI INDICATOR 7.5 (GLOVE) ×2
GOWN STRL REUS W/TWL LRG LVL3 (GOWN DISPOSABLE) ×2 IMPLANT
GOWN STRL REUS W/TWL XL LVL3 (GOWN DISPOSABLE) ×2 IMPLANT
KIT TURNOVER CYSTO (KITS) ×2 IMPLANT
LOOP MONOPOLAR YLW (ELECTROSURGICAL) ×2 IMPLANT
MANIFOLD NEPTUNE II (INSTRUMENTS) ×2 IMPLANT
PACK CYSTO (CUSTOM PROCEDURE TRAY) ×2 IMPLANT
PAD ARMBOARD 7.5X6 YLW CONV (MISCELLANEOUS) ×2 IMPLANT
PLUG CATH AND CAP STER (CATHETERS) ×2 IMPLANT
SYR 10ML LL (SYRINGE) ×2 IMPLANT
SYRINGE IRR TOOMEY STRL 70CC (SYRINGE) ×2 IMPLANT
WATER STERILE IRR 1000ML POUR (IV SOLUTION) ×2 IMPLANT
WATER STERILE IRR 3000ML UROMA (IV SOLUTION) ×2 IMPLANT

## 2017-10-26 NOTE — H&P (Signed)
H&P  Chief Complaint: Bladder abnormality, blood in urine  History of Present Illness: 77 year old male presents fot TURBT. He recently Presented to our office for hematuria evaluation.  This revealed a right bladder wall lesion just over 2 cm in size suspicious for carcinoma in situ.  He had associated urinary urgency, frequency and urgency incontinence.  CT scan of the abdomen and pelvis revealed normal upper tracts with a plaque-like enhancing area on his right bladder wall.  He presents at this time for TURBT plus placement of  Gemcitabine.  Past Medical History:  Diagnosis Date  . AICD (automatic cardioverter/defibrillator) present   . Arthritis   . Coronary artery disease   . Diabetes ([place]) 06/04/2016  . Dyspnea   . Essential hypertension, benign 06/04/2016  . GERD (gastroesophageal reflux disease) 06/04/2016  . High cholesterol   . History of kidney stones   . HOH (hard of hearing)   . Kidney stones 06/04/2016  . Myocardial infarction ([place]) 1999  . Presence of permanent cardiac pacemaker     Past Surgical History:  Procedure Laterality Date  . ANAL FISSURE REPAIR  1968  . BIOPSY  06/22/2016   Procedure: BIOPSY;  Surgeon: Rogene Houston, MD;  Location: [facility] SUITE;  Service: Endoscopy;;  gastric  . CARDIAC DEFIBRILLATOR PLACEMENT    . CORONARY ARTERY BYPASS GRAFT     04/2015  . ESOPHAGOGASTRODUODENOSCOPY N/A 06/22/2016   Procedure: ESOPHAGOGASTRODUODENOSCOPY (EGD);  Surgeon: Rogene Houston, MD;  Location: [facility] SUITE;  Service: Endoscopy;  Laterality: N/A;  7:30  . INSERT / REPLACE / REMOVE PACEMAKER    . JOINT REPLACEMENT Left 2017  . REPLACEMENT TOTAL KNEE     left 2017    Home Medications:    Allergies: No Known Allergies  No family history on file.  Social History:  reports that he quit smoking about 20 years ago. His smoking use included cigarettes. He has a 75.00 pack-year smoking history. He has never used smokeless tobacco. He reports that he does not  drink alcohol or use drugs.  ROS: A complete review of systems was performed.  All systems are negative except for pertinent findings as noted.  Physical Exam:  Vital signs in last 24 hours:   Constitutional:  Alert and oriented, No acute distress Cardiovascular: Regular rate  Respiratory: Normal respiratory effort GI: Abdomen is soft, nontender, nondistended, no abdominal masses Genitourinary: No CVAT. Normal male phallus, testes are descended bilaterally and non-tender and without masses, scrotum is normal in appearance without lesions or masses, perineum is normal on inspection. Lymphatic: No lymphadenopathy Neurologic: Grossly intact, no focal deficits Psychiatric: Normal mood and affect  Laboratory Data:  No results for input(s): WBC, HGB, HCT, PLT in the last 72 hours.  No results for input(s): NA, K, CL, GLUCOSE, BUN, CALCIUM, CREATININE in the last 72 hours.  Invalid input(s): CO3   No results found for this or any previous visit (from the past 24 hour(s)). No results found for this or any previous visit (from the past 240 hour(s)).  Renal Function: Recent Labs    10/21/17 1417  CREATININE 0.93   Estimated Creatinine Clearance: 81.6 mL/min (by C-G formula based on SCr of 0.93 mg/dL).  Radiologic Imaging: No results found.  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

## 2017-10-26 NOTE — Anesthesia Procedure Notes (Signed)
Procedure Name: LMA Insertion Date/Time: 10/26/2017 7:36 AM Performed by: Charmaine Downs, CRNA Pre-anesthesia Checklist: Patient identified, Patient being monitored, Emergency Drugs available, Timeout performed and Suction available Patient Re-evaluated:Patient Re-evaluated prior to induction Oxygen Delivery Method: Circle System Utilized Preoxygenation: Pre-oxygenation with 100% oxygen Induction Type: IV induction Ventilation: Mask ventilation without difficulty LMA: LMA inserted LMA Size: 4.0 Number of attempts: 1 Placement Confirmation: positive ETCO2 and breath sounds checked- equal and bilateral Tube secured with: Tape Dental Injury: Teeth and Oropharynx as per pre-operative assessment

## 2017-10-26 NOTE — Op Note (Signed)
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  Cystoscopy revealed erythematous lesion on the right bladder wall, suspicious for carcinoma in situ.  Upper tracts were normal with CT hematuria protocol.  There was this right sided abnormality on the bladder wall seen on CT scan.  He presents at this time for cystoscopy and bladder biopsy, possible gemcitabine administration.  I discussed the procedure as well as risks and complications with the patient and his family.  They understand and desire to proceed.  Description of procedure: The patient was identified in the holding area.  He received preoperative IV antibiotics and was taken to the operating room where general anesthetic was administered with the LMA.  He is placed in the dorsolithotomy position.  Genitalia and perineum were prepped and draped.  Proper timeout was performed.  52 French resectoscope sheath was passed using the visual obturator.  There was no significant obstruction from the prostate.  Bladder was inspected circumferentially.  Urothelium was normal except for hyperemic area in the right bladder wall consistent with prior lesion seen.  Ureteral orifices were normal in configuration location.  At first I tried to biopsy this lesion with the resectoscope loop using monopolar energy.  There was an obturator reflex.  At this point, there may have been a tiny perforation in the bladder, and I did not feel that further resection with the resectoscope was possible.  I then used a cold cup biopsy  forceps to remove the erythematous urothelium and several different biopsies.  These were sent to pathology.  Following removal of all abnormal urothelium with the cold cup forceps, I cauterized this area with the resectoscope loop until adequate hemostasis was achieved.  There did not seem to be a significant perforation, but I did not feel gemcitabine administration would have been prudent, thus it was not given in the recovery room.  A 20 French Foley catheter was then placed, balloon filled with 10 cc of water.  This was hooked to leg bag.  At this point, the procedure was terminated.  The patient was awakened and taken to the PACU in stable condition.

## 2017-10-26 NOTE — Interval H&P Note (Signed)
History and Physical Interval Note:  10/26/2017 7:26 AM  Michael Key  has presented today for surgery, with the diagnosis of BLADDER TUMOR  The various methods of treatment have been discussed with the patient and family. After consideration of risks, benefits and other options for treatment, the patient has consented to  Procedure(s) with comments: TRANSURETHRAL RESECTION OF BLADDER TUMOR (TURBT) (N/A) - 33 MINS  W/ [place] as a surgical intervention .  The patient's history has been reviewed, patient examined, no change in status, stable for surgery.  I have reviewed the patient's chart and labs.  Questions were answered to the patient's satisfaction.     Lillette Boxer Makell Drohan

## 2017-10-26 NOTE — Discharge Instructions (Signed)
1. You may see some blood in the urine and may have some burning with urination for 48-72 hours. You also may notice that you have to urinate more frequently or urgently after your procedure which is normal.  2. You should call should you develop an inability urinate, fever > 101, persistent nausea and vomiting that prevents you from eating or drinking to stay hydrated.  3. If you have a catheter, you will be taught how to take care of the catheter by the nursing staff prior to discharge from the hospital.  You may periodically feel a strong urge to void with the catheter in place.  This is a bladder spasm and most often can occur when having a bowel movement or moving around. It is typically self-limited and usually will stop after a few minutes.  You may use some Vaseline or Neosporin around the tip of the catheter to reduce friction at the tip of the penis. You may also see some blood in the urine.  A very small amount of blood can make the urine look quite red.  As long as the catheter is draining well, there usually is not a problem.  However, if the catheter is not draining well and is bloody, you should call the office [phone]) to notify us.  You can remove the catheter on Wednesday morning as instructed by the nurses.       4.  It is okay to resume the aspirin once your urine has turned yellow.  PATIENT INSTRUCTIONS POST-ANESTHESIA  IMMEDIATELY FOLLOWING SURGERY:  Do not drive or operate machinery for the first twenty four hours after surgery.  Do not make any important decisions for twenty four hours after surgery or while taking narcotic pain medications or sedatives.  If you develop intractable nausea and vomiting or a severe headache please notify your doctor immediately.  FOLLOW-UP:  Please make an appointment with your surgeon as instructed. You do not need to follow up with anesthesia unless specifically instructed to do so.  WOUND CARE INSTRUCTIONS (if applicable):  Keep a dry clean  dressing on the anesthesia/puncture wound site if there is drainage.  Once the wound has quit draining you may leave it open to air.  Generally you should leave the bandage intact for twenty four hours unless there is drainage.  If the epidural site drains for more than 36-48 hours please call the anesthesia department.  QUESTIONS?:  Please feel free to call your physician or the hospital operator if you have any questions, and they will be happy to assist you.

## 2017-10-26 NOTE — Transfer of Care (Signed)
Immediate Anesthesia Transfer of Care Note  Patient: Michael Key  Procedure(s) Performed: TRANSURETHRAL RESECTION OF BLADDER TUMOR (TURBT) (N/A )  Patient Location: PACU  Anesthesia Type:General  Level of Consciousness: awake and patient cooperative  Airway & Oxygen Therapy: Patient Spontanous Breathing  Post-op Assessment: Report given to RN, Post -op Vital signs reviewed and stable and Patient moving all extremities  Post vital signs: Reviewed and stable  Last Vitals:  Vitals Value Taken Time  BP    Temp    Pulse    Resp    SpO2      Last Pain:  Vitals:   10/26/17 0645  TempSrc: Oral  PainSc: 0-No pain      Patients Stated Pain Goal: 7 (14/60/47 9987)  Complications: No apparent anesthesia complications

## 2017-10-26 NOTE — Anesthesia Preprocedure Evaluation (Signed)
Anesthesia Evaluation  Patient identified by MRN, date of birth, ID band Patient awake    Reviewed: Allergy & Precautions, NPO status , Patient's Chart, lab work & pertinent test results  Airway Mallampati: II  TM Distance: >3 FB Neck ROM: Full    Dental no notable dental hx. (+) Partial Lower, Partial Upper   Pulmonary shortness of breath and with exertion, former smoker,    Pulmonary exam normal breath sounds clear to auscultation       Cardiovascular Exercise Tolerance: Poor hypertension, Pt. on medications and Pt. on home beta blockers + CAD and + Past MI  Normal cardiovascular exam+ pacemaker + Cardiac Defibrillator II Rhythm:Regular Rate:Normal     Neuro/Psych negative neurological ROS  negative psych ROS   GI/Hepatic Neg liver ROS, GERD  Medicated and Controlled,  Endo/Other  negative endocrine ROSdiabetes, Well Controlled, Type 1, Insulin Dependent  Renal/GU Renal diseaseBladder lesions   negative genitourinary   Musculoskeletal  (+) Arthritis ,   Abdominal   Peds negative pediatric ROS (+)  Hematology negative hematology ROS (+)   Anesthesia Other Findings   Reproductive/Obstetrics negative OB ROS                             Anesthesia Physical Anesthesia Plan  ASA: III  Anesthesia Plan: General   Post-op Pain Management:    Induction: Intravenous  PONV Risk Score and Plan:   Airway Management Planned: LMA  Additional Equipment:   Intra-op Plan:   Post-operative Plan: Extubation in OR  Informed Consent: I have reviewed the patients History and Physical, chart, labs and discussed the procedure including the risks, benefits and alternatives for the proposed anesthesia with the patient or authorized representative who has indicated his/her understanding and acceptance.   Dental advisory given  Plan Discussed with: CRNA  Anesthesia Plan Comments:          Anesthesia Quick Evaluation

## 2017-10-26 NOTE — Anesthesia Postprocedure Evaluation (Signed)
Anesthesia Post Note  Patient: Michael Key  Procedure(s) Performed: TRANSURETHRAL RESECTION OF BLADDER TUMOR (TURBT) (N/A )  Patient location during evaluation: PACU Anesthesia Type: General Level of consciousness: awake and patient cooperative Pain management: pain level controlled Vital Signs Assessment: post-procedure vital signs reviewed and stable Respiratory status: spontaneous breathing, nonlabored ventilation and respiratory function stable Cardiovascular status: blood pressure returned to baseline Postop Assessment: no apparent nausea or vomiting Anesthetic complications: no     Last Vitals:  Vitals:   10/26/17 0645  BP: (!) 153/73  Resp: 18  Temp: 36.5 C  SpO2: 97%    Last Pain:  Vitals:   10/26/17 0645  TempSrc: Oral  PainSc: 0-No pain                 Marca Gadsby J

## 2017-10-27 ENCOUNTER — Encounter (HOSPITAL_COMMUNITY): Payer: Self-pay | Admitting: Urology

## 2017-11-16 ENCOUNTER — Ambulatory Visit (INDEPENDENT_AMBULATORY_CARE_PROVIDER_SITE_OTHER): Payer: Medicare Other | Admitting: Urology

## 2017-11-16 DIAGNOSIS — D090 Carcinoma in situ of bladder: Secondary | ICD-10-CM

## 2017-11-24 ENCOUNTER — Ambulatory Visit (INDEPENDENT_AMBULATORY_CARE_PROVIDER_SITE_OTHER): Payer: Medicare Other | Admitting: Urology

## 2017-11-24 DIAGNOSIS — D090 Carcinoma in situ of bladder: Secondary | ICD-10-CM

## 2017-12-01 ENCOUNTER — Ambulatory Visit (INDEPENDENT_AMBULATORY_CARE_PROVIDER_SITE_OTHER): Payer: Medicare Other | Admitting: Urology

## 2017-12-01 DIAGNOSIS — D090 Carcinoma in situ of bladder: Secondary | ICD-10-CM | POA: Diagnosis not present

## 2017-12-08 ENCOUNTER — Ambulatory Visit (INDEPENDENT_AMBULATORY_CARE_PROVIDER_SITE_OTHER): Payer: Medicare Other | Admitting: Urology

## 2017-12-08 DIAGNOSIS — D090 Carcinoma in situ of bladder: Secondary | ICD-10-CM

## 2017-12-15 ENCOUNTER — Ambulatory Visit (INDEPENDENT_AMBULATORY_CARE_PROVIDER_SITE_OTHER): Payer: Medicare Other | Admitting: Urology

## 2017-12-15 DIAGNOSIS — D090 Carcinoma in situ of bladder: Secondary | ICD-10-CM

## 2017-12-22 ENCOUNTER — Ambulatory Visit (INDEPENDENT_AMBULATORY_CARE_PROVIDER_SITE_OTHER): Payer: Medicare Other | Admitting: Urology

## 2017-12-22 DIAGNOSIS — D090 Carcinoma in situ of bladder: Secondary | ICD-10-CM | POA: Diagnosis not present

## 2018-01-07 ENCOUNTER — Ambulatory Visit (INDEPENDENT_AMBULATORY_CARE_PROVIDER_SITE_OTHER): Payer: Medicare Other | Admitting: Urology

## 2018-01-07 DIAGNOSIS — D090 Carcinoma in situ of bladder: Secondary | ICD-10-CM

## 2018-03-01 ENCOUNTER — Ambulatory Visit (INDEPENDENT_AMBULATORY_CARE_PROVIDER_SITE_OTHER): Payer: Medicare Other | Admitting: Urology

## 2018-03-01 ENCOUNTER — Other Ambulatory Visit (HOSPITAL_COMMUNITY)
Admission: RE | Admit: 2018-03-01 | Discharge: 2018-03-01 | Disposition: A | Discharge status: Home / Self Care | Payer: Medicare Other | Source: Ambulatory Visit | Attending: Urology | Admitting: Urology

## 2018-03-01 DIAGNOSIS — D090 Carcinoma in situ of bladder: Secondary | ICD-10-CM | POA: Insufficient documentation

## 2018-03-15 ENCOUNTER — Ambulatory Visit (INDEPENDENT_AMBULATORY_CARE_PROVIDER_SITE_OTHER): Payer: Medicare Other | Admitting: Urology

## 2018-03-15 DIAGNOSIS — D090 Carcinoma in situ of bladder: Secondary | ICD-10-CM

## 2018-03-23 ENCOUNTER — Other Ambulatory Visit (HOSPITAL_COMMUNITY)
Admission: RE | Admit: 2018-03-23 | Discharge: 2018-03-23 | Disposition: A | Discharge status: Home / Self Care | Payer: Medicare Other | Source: Other Acute Inpatient Hospital | Attending: Urology | Admitting: Urology

## 2018-03-23 ENCOUNTER — Ambulatory Visit (INDEPENDENT_AMBULATORY_CARE_PROVIDER_SITE_OTHER): Payer: Medicare Other | Admitting: Urology

## 2018-03-23 DIAGNOSIS — N390 Urinary tract infection, site not specified: Secondary | ICD-10-CM | POA: Diagnosis present

## 2018-03-23 DIAGNOSIS — D090 Carcinoma in situ of bladder: Secondary | ICD-10-CM | POA: Diagnosis not present

## 2018-03-24 LAB — URINE CULTURE: Culture: <10000 — AB

## 2018-03-29 ENCOUNTER — Ambulatory Visit (INDEPENDENT_AMBULATORY_CARE_PROVIDER_SITE_OTHER): Payer: Medicare Other | Admitting: Urology

## 2018-03-29 ENCOUNTER — Other Ambulatory Visit: Payer: Self-pay

## 2018-03-29 DIAGNOSIS — D090 Carcinoma in situ of bladder: Secondary | ICD-10-CM | POA: Diagnosis not present

## 2018-05-28 IMAGING — NM NM GASTRIC EMPTYING
6 series · 6 of 6 positions shown · non-contrast
Comparison: None

CLINICAL DATA: Chronic belching after eating, symptoms for over 1
year ago worsening, diabetes mellitus

EXAM:
NUCLEAR MEDICINE GASTRIC EMPTYING SCAN
TECHNIQUE: After oral ingestion of radiolabeled meal, sequential abdominal
images were obtained for 120 minutes. Residual percentage of
activity remaining within the stomach was calculated at 60 and 120
minutes.
RADIOPHARMACEUTICALS:  2.1 mCi Nc-GGm sulfur colloid in standardized
meal

[Series 1: 0 min · 4.14mm/px · 1 of 1 slices shown (1 of 2)]
[im 1/1]
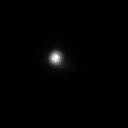

[Series 1: 0 min · 4.14mm/px · 1 of 1 slices shown (2 of 2)]
[im 1/1]
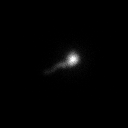

[Series 2: 60 min · 4.14mm/px · 1 of 1 slices shown (1 of 2)]
[im 1/1]
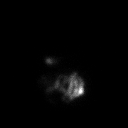

[Series 2: 60 min · 4.14mm/px · 1 of 1 slices shown (2 of 2)]
[im 1/1  full-range]
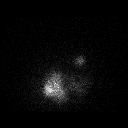

[Series 3: 120 min · 4.14mm/px · 1 of 1 slices shown (1 of 2)]
[im 1/1  full-range]
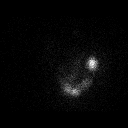

[Series 3: 120 min · 4.14mm/px · 1 of 1 slices shown (2 of 2)]
[im 1/1]
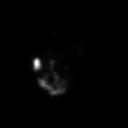

[6 of 6 positions shown; findings below may reference images not displayed]

FINDINGS: Expected location of the stomach in the left upper quadrant.

Ingested meal empties the stomach gradually over the course of the
study.

87% emptying at 1 hour.

97% emptying at 2 hours.

Findings represent normal gastric emptying.
IMPRESSION: Normal solid phase gastric emptying.

## 2018-07-05 ENCOUNTER — Ambulatory Visit (INDEPENDENT_AMBULATORY_CARE_PROVIDER_SITE_OTHER): Payer: Medicare Other | Admitting: Urology

## 2018-07-05 ENCOUNTER — Other Ambulatory Visit (HOSPITAL_COMMUNITY)
Admission: RE | Admit: 2018-07-05 | Discharge: 2018-07-05 | Disposition: A | Discharge status: Home / Self Care | Payer: Medicare Other | Source: Ambulatory Visit | Attending: Urology | Admitting: Urology

## 2018-07-05 DIAGNOSIS — C679 Malignant neoplasm of bladder, unspecified: Secondary | ICD-10-CM | POA: Diagnosis present

## 2018-07-26 ENCOUNTER — Ambulatory Visit (INDEPENDENT_AMBULATORY_CARE_PROVIDER_SITE_OTHER): Payer: Medicare Other | Admitting: Urology

## 2018-07-26 DIAGNOSIS — C679 Malignant neoplasm of bladder, unspecified: Secondary | ICD-10-CM

## 2018-08-02 ENCOUNTER — Ambulatory Visit (INDEPENDENT_AMBULATORY_CARE_PROVIDER_SITE_OTHER): Payer: Medicare Other | Admitting: Urology

## 2018-08-02 DIAGNOSIS — C679 Malignant neoplasm of bladder, unspecified: Secondary | ICD-10-CM

## 2018-08-09 ENCOUNTER — Ambulatory Visit (INDEPENDENT_AMBULATORY_CARE_PROVIDER_SITE_OTHER): Payer: Medicare Other | Admitting: Urology

## 2018-08-09 DIAGNOSIS — C679 Malignant neoplasm of bladder, unspecified: Secondary | ICD-10-CM | POA: Diagnosis not present

## 2018-09-20 ENCOUNTER — Other Ambulatory Visit (HOSPITAL_COMMUNITY)
Admission: RE | Admit: 2018-09-20 | Discharge: 2018-09-20 | Disposition: A | Discharge status: Home / Self Care | Payer: Medicare Other | Source: Ambulatory Visit | Attending: Urology | Admitting: Urology

## 2018-09-20 ENCOUNTER — Ambulatory Visit (INDEPENDENT_AMBULATORY_CARE_PROVIDER_SITE_OTHER): Payer: Medicare Other | Admitting: Urology

## 2018-09-20 DIAGNOSIS — C678 Malignant neoplasm of overlapping sites of bladder: Secondary | ICD-10-CM | POA: Insufficient documentation

## 2018-09-21 LAB — CYTOLOGY - NON PAP

## 2018-12-20 ENCOUNTER — Telehealth: Payer: Self-pay | Admitting: Urology

## 2018-12-20 ENCOUNTER — Other Ambulatory Visit: Payer: Self-pay

## 2018-12-20 ENCOUNTER — Ambulatory Visit (INDEPENDENT_AMBULATORY_CARE_PROVIDER_SITE_OTHER): Payer: Medicare Other

## 2018-12-20 VITALS — BP 129/71 | HR 80 | Temp 96.8°F

## 2018-12-20 DIAGNOSIS — C679 Malignant neoplasm of bladder, unspecified: Secondary | ICD-10-CM | POA: Diagnosis not present

## 2018-12-20 LAB — POCT URINALYSIS DIPSTICK
Bilirubin, UA: NEGATIVE
Glucose, UA: NEGATIVE
Ketones, UA: NEGATIVE
Leukocytes, UA: NEGATIVE
Nitrite, UA: NEGATIVE
Protein, UA: POSITIVE — AB
Spec Grav, UA: 1.02 (ref 1.010–1.025)
Urobilinogen, UA: NEGATIVE E.U./dL — AB
pH, UA: 6 (ref 5.0–8.0)

## 2018-12-20 MED ORDER — BCG LIVE 50 MG IS SUSR
3.2400 mL | Freq: Once | INTRAVESICAL | Status: AC
  Administered 2018-12-20: 81 mg via INTRAVESICAL

## 2018-12-20 NOTE — Progress Notes (Signed)
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 Patient was cleaned and prepped in a sterile fashion with betadine and lidocaine 2% jelly was instilled into the urethra.  A 14FR catheter was inserted, urine return was noted 70ml, urine was yellow in color.  97ml of reconstituted BCG was instilled into the bladder. The catheter was then removed. Patient tolerated well, no complications were noted  Preformed by: Khari Lett, LPN  Follow up/ Additional notes: 1 wk

## 2018-12-27 ENCOUNTER — Other Ambulatory Visit: Payer: Self-pay | Admitting: Urology

## 2018-12-27 ENCOUNTER — Ambulatory Visit (INDEPENDENT_AMBULATORY_CARE_PROVIDER_SITE_OTHER): Payer: Medicare Other

## 2018-12-27 VITALS — Temp 97.2°F

## 2018-12-27 DIAGNOSIS — C679 Malignant neoplasm of bladder, unspecified: Secondary | ICD-10-CM

## 2018-12-27 LAB — POCT URINALYSIS DIPSTICK
Bilirubin, UA: NEGATIVE
Blood, UA: NEGATIVE
Glucose, UA: NEGATIVE
Ketones, UA: NEGATIVE
Leukocytes, UA: NEGATIVE
Nitrite, UA: NEGATIVE
Protein, UA: POSITIVE — AB
Spec Grav, UA: 1.015 (ref 1.010–1.025)
Urobilinogen, UA: NEGATIVE E.U./dL — AB
pH, UA: 5 (ref 5.0–8.0)

## 2018-12-27 MED ORDER — BCG LIVE 50 MG IS SUSR
3.2400 mL | Freq: Once | INTRAVESICAL | Status: AC
  Administered 2018-12-27: 81 mg via INTRAVESICAL

## 2018-12-27 MED ORDER — TAMSULOSIN HCL 0.4 MG PO CAPS: 0.4000 mg | ORAL_CAPSULE | Freq: Every day | ORAL | 11 refills | Status: DC

## 2018-12-27 NOTE — Progress Notes (Signed)
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 Patient was cleaned and prepped in a sterile fashion with betadine and lidocaine 2% jelly was instilled into the urethra.  A 14FR catheter was inserted, urine return was noted 106ml, urine was yelow in color.  50ml of reconstituted BCG was instilled into the bladder. The catheter was then removed. Patient tolerated well, no complications were noted  Preformed by: [email or web address]  Follow up/ Additional notes: none

## 2019-01-03 ENCOUNTER — Ambulatory Visit: Payer: Medicare Other

## 2019-01-03 ENCOUNTER — Telehealth: Payer: Self-pay | Admitting: Urology

## 2019-01-03 ENCOUNTER — Other Ambulatory Visit: Payer: Self-pay

## 2019-01-03 DIAGNOSIS — C679 Malignant neoplasm of bladder, unspecified: Secondary | ICD-10-CM

## 2019-01-03 MED ORDER — TAMSULOSIN HCL 0.4 MG PO CAPS: 0.4000 mg | ORAL_CAPSULE | Freq: Every day | ORAL | 11 refills | Status: DC

## 2019-01-03 NOTE — Telephone Encounter (Signed)
Pt wife called for Michael Key, She wants to know if a prescription for tamsulosin can be sent in.

## 2019-01-03 NOTE — Telephone Encounter (Signed)
Pt called back, she stated the pharmacy was having computer issues last week and need it to be resent.

## 2019-01-03 NOTE — Telephone Encounter (Signed)
rx sent in for tamsulosin . Original rx was not received by pharmacy

## 2019-01-03 NOTE — Telephone Encounter (Signed)
Called wife and notified rx sent to [facility] on 12/22. Wife will call [facility] to verify they received rx for tamsulosin.

## 2019-01-04 ENCOUNTER — Other Ambulatory Visit: Payer: Self-pay

## 2019-01-04 ENCOUNTER — Ambulatory Visit (INDEPENDENT_AMBULATORY_CARE_PROVIDER_SITE_OTHER): Payer: Medicare Other

## 2019-01-04 DIAGNOSIS — C679 Malignant neoplasm of bladder, unspecified: Secondary | ICD-10-CM

## 2019-01-04 LAB — POCT URINALYSIS DIPSTICK
Bilirubin, UA: NEGATIVE
Blood, UA: NEGATIVE
Glucose, UA: NEGATIVE
Ketones, UA: NEGATIVE
Nitrite, UA: NEGATIVE
Protein, UA: NEGATIVE
Spec Grav, UA: 1.015 (ref 1.010–1.025)
Urobilinogen, UA: NEGATIVE E.U./dL — AB
pH, UA: 5 (ref 5.0–8.0)

## 2019-01-04 MED ORDER — BCG LIVE 50 MG IS SUSR
3.2400 mL | Freq: Once | INTRAVESICAL | Status: AC
  Administered 2019-01-04: 81 mg via INTRAVESICAL

## 2019-01-04 MED ORDER — TAMSULOSIN HCL 0.4 MG PO CAPS: 0.4000 mg | ORAL_CAPSULE | Freq: Every day | ORAL | 3 refills | Status: DC

## 2019-01-04 NOTE — Progress Notes (Signed)
BCG Bladder Instillation  BCG # 3  Due to Bladder Cancer patient is present today for a BCG treatment. Patient was cleaned and prepped in a sterile fashion with betadine.  A 14FR catheter was inserted, urine return was noted 161ml, urine was yellow in color.  41ml of reconstituted BCG was instilled into the bladder. The catheter was then removed. Patient tolerated well, no complications were noted  Preformed by: Gibson Ramp. RN  Follow up/ Additional notes: f/u in 6 weeks with Dr. Diona Fanti with cysto.

## 2019-02-28 ENCOUNTER — Other Ambulatory Visit (HOSPITAL_COMMUNITY)
Admission: RE | Admit: 2019-02-28 | Discharge: 2019-02-28 | Disposition: A | Discharge status: Home / Self Care | Payer: Medicare Other | Source: Ambulatory Visit | Attending: Urology | Admitting: Urology

## 2019-02-28 ENCOUNTER — Other Ambulatory Visit: Payer: Self-pay

## 2019-02-28 ENCOUNTER — Encounter: Payer: Self-pay | Admitting: Urology

## 2019-02-28 ENCOUNTER — Ambulatory Visit (INDEPENDENT_AMBULATORY_CARE_PROVIDER_SITE_OTHER): Payer: Medicare Other | Admitting: Urology

## 2019-02-28 VITALS — BP 139/72 | HR 84 | Temp 96.1°F | Ht 72.0 in | Wt 212.0 lb

## 2019-02-28 DIAGNOSIS — C678 Malignant neoplasm of overlapping sites of bladder: Secondary | ICD-10-CM | POA: Diagnosis not present

## 2019-02-28 LAB — POCT URINALYSIS DIPSTICK
Bilirubin, UA: NEGATIVE
Blood, UA: NEGATIVE
Glucose, UA: NEGATIVE
Ketones, UA: NEGATIVE
Leukocytes, UA: NEGATIVE
Nitrite, UA: NEGATIVE
Protein, UA: NEGATIVE
Spec Grav, UA: <=1.005 — AB (ref 1.010–1.025)
Urobilinogen, UA: NEGATIVE E.U./dL — AB
pH, UA: 6 (ref 5.0–8.0)

## 2019-02-28 MED ORDER — CIPROFLOXACIN HCL 500 MG PO TABS
500.0000 mg | ORAL_TABLET | Freq: Once | ORAL | Status: AC
  Administered 2019-02-28: 500 mg via ORAL

## 2019-02-28 NOTE — Progress Notes (Signed)

## 2019-02-28 NOTE — Progress Notes (Signed)
H&P  Chief Complaint: Bladder Cancer  History of Present Illness:   2.23.2021: Here today for follow-up cysto following completion of third maintenance BCG on 12.30.2020.   (below copied from [facility] records):  Michael Key is a 79 year-old male established patient who is here for follow-up of bladder cancer treatment.  He had treatment with the following intravesical agents: bcg. Patient denies mitomycin, interferon, and adriamycin.   He has not recently had unwanted weight loss.   10.22.2019: TURBT/bladder biopsy. Pathology revealed carcinoma in situ.   2.25.2020: He completed 6 induction instillations of BCG on 1.3.2020. Cystoscopy c/w BCG reaction, cytology revealed atypia/inflamation.   3.24.2020: Completed 1st maintenance BCG (3 Rxs).   6.30.2020: Patient returns for follow up cystoscopy following two maintenances of BCG (third delayed due to [place]). Cysto nml.   8.4.2020: Completed 2nd BCG maintenance rx.   9.15.2020: No voiding c/o's, no blood in urine. He tolerated BCG treatments well.   12.15.2020: Maintenance BCG #3 of 3   Past Medical History:  Diagnosis Date  . AICD (automatic cardioverter/defibrillator) present   . Arthritis   . Coronary artery disease   . Diabetes ([place]) 06/04/2016  . Dyspnea   . Essential hypertension, benign 06/04/2016  . GERD (gastroesophageal reflux disease) 06/04/2016  . High cholesterol   . History of kidney stones   . HOH (hard of hearing)   . Kidney stones 06/04/2016  . Myocardial infarction ([place]) 1999  . Presence of permanent cardiac pacemaker     Past Surgical History:  Procedure Laterality Date  . ANAL FISSURE REPAIR  1968  . BIOPSY  06/22/2016   Procedure: BIOPSY;  Surgeon: Rogene Houston, MD;  [facility];  Service: Endoscopy;;  gastric  . CARDIAC DEFIBRILLATOR PLACEMENT    . CORONARY ARTERY BYPASS GRAFT     04/2015  . ESOPHAGOGASTRODUODENOSCOPY N/A 06/22/2016   Procedure: ESOPHAGOGASTRODUODENOSCOPY (EGD);   Surgeon: Rogene Houston, MD;  Location: [facility];  Service: Endoscopy;  Laterality: N/A;  7:30  . INSERT / REPLACE / REMOVE PACEMAKER    . JOINT REPLACEMENT Left 2017  . REPLACEMENT TOTAL KNEE     left 2017  . TRANSURETHRAL RESECTION OF BLADDER TUMOR N/A 10/26/2017   Procedure: TRANSURETHRAL RESECTION OF BLADDER TUMOR (TURBT);  Surgeon: Franchot Gallo, MD;  Location: [facility];  Service: Urology;  Laterality: N/A;    Home Medications:  Allergies as of 02/28/2019   No Known Allergies     Medication List       Accurate as of February 28, 2019  8:52 AM. If you have any questions, ask your nurse or doctor.        acetaminophen 500 MG tablet Commonly known as: TYLENOL Take 1,000 mg by mouth every 6 (six) hours as needed for moderate pain or headache.   carvedilol 12.5 MG tablet Commonly known as: COREG Take 12.5 mg by mouth 2 (two) times daily with a meal.   cyanocobalamin 1000 MCG/ML injection Commonly known as: (VITAMIN B-12) Inject 1,000 mcg into the muscle every 14 (fourteen) days.   insulin glargine 100 UNIT/ML injection Commonly known as: LANTUS Inject 60 Units into the skin at bedtime.   insulin lispro 100 UNIT/ML injection Commonly known as: HUMALOG Inject 22 Units into the skin 3 (three) times daily with meals.   levocetirizine 5 MG tablet Commonly known as: XYZAL Take 5 mg by mouth daily.   lisinopril 10 MG tablet Commonly known as: ZESTRIL Take 10 mg by mouth daily.  pantoprazole 40 MG tablet Commonly known as: PROTONIX Take 40 mg by mouth daily.   rosuvastatin 20 MG tablet Commonly known as: CRESTOR Take 20 mg by mouth daily.   sitaGLIPtin-metformin 50-1000 MG tablet Commonly known as: JANUMET Take 1 tablet by mouth 2 (two) times daily with a meal.   tamsulosin 0.4 MG Caps capsule Commonly known as: FLOMAX Take 1 capsule (0.4 mg total) by mouth daily.       Allergies: No Known Allergies  No family history on file.  Social History:   reports that he quit smoking about 22 years ago. His smoking use included cigarettes. He has a 75.00 pack-year smoking history. He has never used smokeless tobacco. He reports that he does not drink alcohol or use drugs.  ROS: A complete review of systems was performed.  All systems are negative except for pertinent findings as noted.  Physical Exam:  Vital signs in last 24 hours: There were no vitals taken for this visit. Constitutional:  Alert and oriented, No acute distress Cardiovascular: Regular rate  Respiratory: Normal respiratory effort Genitourinary: Normal male phallus, testes are descended bilaterally and non-tender and without masses, scrotum is normal in appearance without lesions or masses, perineum is normal on inspection. Lymphatic: No lymphadenopathy Neurologic: Grossly intact, no focal deficits Psychiatric: Normal mood and affect  I have reviewed prior pt notes  I have reviewed urinalysis results  Cystoscopy Procedure Note:  Indication: Followup of bladder cancer  After informed consent and discussion of the procedure and its risks, Michael Key was positioned and prepped in the standard fashion. Cystoscopy was performed with a flexible cystoscope. The urethra, bladder neck and entire bladder was visualized in a standard fashion. The prostate was obstructive. The ureteral orifices were visualized in their normal location and orientation. Urothelium was normal.  Procedure tolerated well.  I   Impression/Assessment:  Bladder cancer/CIS, s/p induction/3 maint BCG Rx's w/ nml cysto today  Plan:  OV/cysto 6 mos

## 2019-03-01 LAB — CYTOLOGY - NON PAP

## 2019-03-24 NOTE — Progress Notes (Signed)
Results mailed to pt - results were received by Dr. Diona Fanti to inform pt

## 2019-07-13 ENCOUNTER — Encounter: Payer: Self-pay | Admitting: Nurse Practitioner

## 2019-07-27 NOTE — Progress Notes (Signed)
07/27/2019 Michael Key [id number] [birth year]   CHIEF COMPLAINT: Diarrhea   HISTORY OF PRESENT ILLNESS: Michael Key is a 79 year old male with a past medical history of hypertension, coronary artery disease MI s/p 2 stents 1989,  S/P 3 vessel CABG 2017 s/p pacemaker and AICD, DM II, kidney stones, bladder cancer 10/2017, hypothyroidism and GERD. S/P anal fissure repair in 1968.  S/P right TKR 2016. He presents today accompanied by his wife.  He complains of having intermittent urgent diarrhea which results in fecal incontinence which is interfering with his quality of living.  He initially had infrequent episodes of fecal incontinence which started 1 or 2 years ago.  He is now having urgent diarrhea BMs a few times monthly.  Episodes of soiling himself occur when he is away from home such as when he was in the grocery store which is quite frustrating. Cantaloupe perhaps trigger these episodes.  His dairy intake is limited and he reported eating ice cream daily for the past 2 to 3 weeks.  He denies having any associated upper or lower abdominal pain.  No rectal bleeding.  No melena.  No recent antibiotics.  The days he does not have diarrhea he is passing a normal brown formed stool.  No floating stools or oily discharge in the toilet water.  He possibly underwent a colonoscopy by Dr. Dorien Chihuahua in [place] in 2016 or by Dr. Earley Brooke in [place].  Our office has requested a copy of his colonoscopy report.  History of GERD.  He is taking Pantoprazole 40 mg once daily for at least the past 5 years.  He recently started taking Pepcid at bedtime due to increased reflux symptoms which has resolved.  No dysphagia.  He underwent an EGD 06/22/2016 by Dr. 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  He is taking ASA 81 mg once daily.  No other NSAIDs.   EGD 06/22/2016 by Dr. Laural Golden: - Two small cysts located along the rightwall of hypopharynx. -  Normal esophagus. - Z-line regular, 40 cm from the incisors. - Multiple gastric polyps. Biopsied. - Gastric antral vascular ectasia without bleeding. - Normal duodenal bulb and second portion of the duodenum.  Past Medical History:  Diagnosis Date  . AICD (automatic cardioverter/defibrillator) present   . Arthritis   . Coronary artery disease   . Diabetes ([place]) 06/04/2016  . Dyspnea   . Essential hypertension, benign 06/04/2016  . GERD (gastroesophageal reflux disease) 06/04/2016  . High cholesterol   . History of kidney stones   . HOH (hard of hearing)   . Kidney stones 06/04/2016  . Myocardial infarction ([place]) 1999  . Presence of permanent cardiac pacemaker    Past Surgical History:  Procedure Laterality Date  . ANAL FISSURE REPAIR  1968  . BIOPSY  06/22/2016   Procedure: BIOPSY;  Surgeon: Rogene Houston, MD;  [facility];  Service: Endoscopy;;  gastric  . CARDIAC DEFIBRILLATOR PLACEMENT    . CORONARY ARTERY BYPASS GRAFT     04/2015  . ESOPHAGOGASTRODUODENOSCOPY N/A 06/22/2016   Procedure: ESOPHAGOGASTRODUODENOSCOPY (EGD);  Surgeon: Rogene Houston, MD;  [facility];  Service: Endoscopy;  Laterality: N/A;  7:30  . INSERT / REPLACE / REMOVE PACEMAKER    . JOINT REPLACEMENT Left 2017  . REPLACEMENT TOTAL KNEE     left 2017  . TRANSURETHRAL RESECTION OF BLADDER TUMOR N/A 10/26/2017   Procedure: TRANSURETHRAL RESECTION OF BLADDER TUMOR (  TURBT);  Surgeon: Franchot Gallo, MD;  Location: [facility];  Service: Urology;  Laterality: N/A;    Social History: He is married.  He has 1 son and 1 daughter.  He is retired.  1ppd x 20+ yrs quit 1999. No alcohol. No drug use.   Family History: Sister with DM. Sister with alzheimer's disease.   No Known Allergies    Outpatient Encounter Medications as of 07/28/2019  Medication Sig  . acetaminophen (TYLENOL) 500 MG tablet Take 1,000 mg by mouth every 6 (six) hours as needed for moderate pain or headache.   .  carvedilol (COREG) 12.5 MG tablet Take 12.5 mg by mouth 2 (two) times daily with a meal.   . cyanocobalamin (,VITAMIN B-12,) 1000 MCG/ML injection Inject 1,000 mcg into the muscle every 14 (fourteen) days.  . insulin glargine (LANTUS) 100 UNIT/ML injection Inject 60 Units into the skin at bedtime.   . insulin lispro (HUMALOG) 100 UNIT/ML injection Inject 22 Units into the skin 3 (three) times daily with meals.   Marland Kitchen levocetirizine (XYZAL) 5 MG tablet Take 5 mg by mouth daily.  Marland Kitchen lisinopril (PRINIVIL,ZESTRIL) 10 MG tablet Take 10 mg by mouth daily.  . pantoprazole (PROTONIX) 40 MG tablet Take 40 mg by mouth daily.  . rosuvastatin (CRESTOR) 20 MG tablet Take 20 mg by mouth daily.  . sitaGLIPtin-metformin (JANUMET) 50-1000 MG tablet Take 1 tablet by mouth 2 (two) times daily with a meal.  . tamsulosin (FLOMAX) 0.4 MG CAPS capsule Take 1 capsule (0.4 mg total) by mouth daily.   No facility-administered encounter medications on file as of 07/28/2019.     REVIEW OF SYSTEMS:   Gen: Denies fever, sweats or chills. No weight loss.  CV: Denies chest pain, palpitations or edema. Resp: Denies cough, shortness of breath of hemoptysis.  GI: Denies heartburn, dysphagia, stomach or lower abdominal pain. No diarrhea or constipation.  GU : Denies urinary burning, blood in urine, increased urinary frequency or incontinence. MS: Denies joint pain, muscles aches or weakness. Derm: Denies rash, itchiness, skin lesions or unhealing ulcers. Psych: Denies depression, anxiety, memory loss, suicidal ideation and confusion. Heme: Denies bruising, bleeding. Neuro:  Denies headaches, dizziness or paresthesias. Endo:  Denies any problems with DM, thyroid or adrenal function.    PHYSICAL EXAM: BP (!) 114/58   Pulse 72   Ht 6' (1.829 m)   Wt (!) 215 lb (97.5 kg)   BMI 29.16 kg/m   General: Well developed 79 year old male in no acute distress. Head: Normocephalic and atraumatic. Eyes:  Sclerae non-icteric,  conjunctive pink. Ears: Normal auditory acuity. Mouth: Upper and lower dentures. No ulcers or lesions.  Neck: Supple, no lymphadenopathy or thyromegaly.  Lungs: Clear bilaterally to auscultation without wheezes, crackles or rhonchi. Heart: Regular rate and rhythm. No murmur, rub or gallop appreciated.  Abdomen: Soft, nontender, non distended. No masses. No hepatosplenomegaly. Normoactive bowel sounds x 4 quadrants.  Rectal: No external hemorrhoids. Anterior anal tags. Limited rectal exam, brown soft stool negative. No fecal impaction. Prostate enlarged. Diminished anal sphincter tone. Musculoskeletal: Symmetrical with no gross deformities. Skin: Warm and dry. No rash or lesions on visible extremities. Extremities: No edema. Neurological: Alert oriented x 4, no focal deficits.  Psychological:  Alert and cooperative. Normal mood and affect.  ASSESSMENT AND PLAN:  46.  79 year old male with intermittent urgent diarrhea with fecal incontinence -Request copy of his colonoscopy procedure and biopsy results from Dr. Laural Golden -Hardin Negus bacteria probiotic once daily -Lactaid 1-2 tabs with each  dairy product or no dairy products for 1 month -Benefiber 1tbsp daily if tolerated -Take Imodium 1/2-1 tab daily before leaving  Home -To follow-up in the office with Dr. Ardis Hughs in 6 weeks -Patient to call our office if symptoms worsen  2. CAD, MI, stent x 2, s/p 3 vessel CABG 2017 s/p  AICD and pacemaker on ASA  3. History of bladder cancer   4. GERD. On Pantoprazole 40mg  QD and Famotidine 2omg at bed time.   5. DM II  CC:  Pomposini, Cherly Anderson, MD

## 2019-07-28 ENCOUNTER — Ambulatory Visit (INDEPENDENT_AMBULATORY_CARE_PROVIDER_SITE_OTHER): Payer: Medicare Other | Admitting: Nurse Practitioner

## 2019-07-28 ENCOUNTER — Encounter: Payer: Self-pay | Admitting: Nurse Practitioner

## 2019-07-28 VITALS — BP 114/58 | HR 72 | Ht 72.0 in | Wt 215.0 lb

## 2019-07-28 DIAGNOSIS — K589 Irritable bowel syndrome without diarrhea: Secondary | ICD-10-CM

## 2019-07-28 DIAGNOSIS — R159 Full incontinence of feces: Secondary | ICD-10-CM | POA: Diagnosis not present

## 2019-07-28 DIAGNOSIS — R197 Diarrhea, unspecified: Secondary | ICD-10-CM | POA: Diagnosis not present

## 2019-07-28 NOTE — Progress Notes (Signed)
I agree with the above note, plan 

## 2019-07-28 NOTE — Patient Instructions (Addendum)
If you are age 79 or older, your body mass index should be between 23-30. Your Body mass index is 29.16 kg/m. If this is out of the aforementioned range listed, please consider follow up with your Primary Care Provider.  If you are age 72 or younger, your body mass index should be between 19-25. Your Body mass index is 29.16 kg/m. If this is out of the aformentioned range listed, please consider follow up with your Primary Care Provider.   Please purchase the following medications over the counter and take as directed:  1. Probiotic 2. Lactaid 1-2 tablets before eating. 3. Benefiber 1 tablespoon daily 4. Take 1/2 Imodium prior to leaving home 5.Follow up with Dr Havery Moros in 6 weeks. We will call you with an appointment.  Due to recent changes in healthcare laws, you may see the results of your imaging and laboratory studies on MyChart before your provider has had a chance to review them.  We understand that in some cases there may be results that are confusing or concerning to you. Not all laboratory results come back in the same time frame and the provider may be waiting for multiple results in order to interpret others.  Please give [place] 48 hours in order for your provider to thoroughly review all the results before contacting the office for clarification of your results.   Thank you for choosing [facility] Michael Key, CRNP

## 2019-08-29 ENCOUNTER — Other Ambulatory Visit: Payer: Self-pay

## 2019-08-29 ENCOUNTER — Ambulatory Visit (INDEPENDENT_AMBULATORY_CARE_PROVIDER_SITE_OTHER): Payer: Medicare Other | Admitting: Urology

## 2019-08-29 ENCOUNTER — Encounter: Payer: Self-pay | Admitting: Urology

## 2019-08-29 VITALS — BP 133/74 | HR 68 | Temp 98.1°F | Ht 72.0 in | Wt 210.0 lb

## 2019-08-29 DIAGNOSIS — C678 Malignant neoplasm of overlapping sites of bladder: Secondary | ICD-10-CM | POA: Diagnosis not present

## 2019-08-29 DIAGNOSIS — N3281 Overactive bladder: Secondary | ICD-10-CM | POA: Diagnosis not present

## 2019-08-29 LAB — URINALYSIS, ROUTINE W REFLEX MICROSCOPIC
Bilirubin, UA: NEGATIVE
Glucose, UA: NEGATIVE
Ketones, UA: NEGATIVE
Leukocytes,UA: NEGATIVE
Nitrite, UA: NEGATIVE
Protein,UA: NEGATIVE
RBC, UA: NEGATIVE
Specific Gravity, UA: 1.01 (ref 1.005–1.030)
Urobilinogen, Ur: 0.2 mg/dL (ref 0.2–1.0)
pH, UA: 5 (ref 5.0–7.5)

## 2019-08-29 MED ORDER — CIPROFLOXACIN HCL 500 MG PO TABS
500.0000 mg | ORAL_TABLET | Freq: Once | ORAL | Status: AC
  Administered 2019-08-29: 500 mg via ORAL

## 2019-08-29 NOTE — Progress Notes (Signed)
H&P  Chief Complaint: Follow-up of bladder cancer  History of Present Illness: This patient is here today for cystoscopy for follow-up of urothelial carcinoma.  10.22.2019: TURBT/bladder biopsy. Pathology revealed carcinoma in situ.   2.25.2020: He completed 6 induction instillations of BCG on 1.3.2020. Cystoscopy c/w BCG reaction, cytology revealed atypia/inflamation.   3.24.2020: Completed 1st maintenance BCG (3 Rxs).   6.30.2020: Patient returns for follow up cystoscopy following two maintenances of BCG (third delayed due to [place]). Cysto nml.   8.4.2020: Completed 2nd BCG maintenance rx.   9.15.2020: Cystoscopy negative.  12.15.2020: Maintenance BCG #3 of 3   2.23.2021: Cystoscopy/cytology were  Negative.  8.24.2021: No mention of blood in the urine.  He does have urinary frequency and feeling of incomplete emptying, however.  Past Medical History:  Diagnosis Date  . AICD (automatic cardioverter/defibrillator) present   . Arthritis   . Coronary artery disease   . Diabetes ([place]) 06/04/2016  . Dyspnea   . Essential hypertension, benign 06/04/2016  . GERD (gastroesophageal reflux disease) 06/04/2016  . High cholesterol   . History of kidney stones   . HOH (hard of hearing)   . Kidney stones 06/04/2016  . Myocardial infarction ([place]) 1999  . Presence of permanent cardiac pacemaker     Past Surgical History:  Procedure Laterality Date  . ANAL FISSURE REPAIR  1968  . BIOPSY  06/22/2016   Procedure: BIOPSY;  Surgeon: Rogene Houston, MD;  [facility];  Service: Endoscopy;;  gastric  . CARDIAC DEFIBRILLATOR PLACEMENT    . CORONARY ARTERY BYPASS GRAFT     04/2015  . ESOPHAGOGASTRODUODENOSCOPY N/A 06/22/2016   Procedure: ESOPHAGOGASTRODUODENOSCOPY (EGD);  Surgeon: Rogene Houston, MD;  [facility];  Service: Endoscopy;  Laterality: N/A;  7:30  . INSERT / REPLACE / REMOVE PACEMAKER    . JOINT REPLACEMENT Left 2017  . REPLACEMENT TOTAL KNEE      left 2017  . TRANSURETHRAL RESECTION OF BLADDER TUMOR N/A 10/26/2017   Procedure: TRANSURETHRAL RESECTION OF BLADDER TUMOR (TURBT);  Surgeon: Franchot Gallo, MD;  Location: [facility];  Service: Urology;  Laterality: N/A;    Home Medications:  Allergies as of 08/29/2019   No Known Allergies     Medication List       Accurate as of August 29, 2019  8:57 AM. If you have any questions, ask your nurse or doctor.        acetaminophen 500 MG tablet Commonly known as: TYLENOL Take 1,000 mg by mouth every 6 (six) hours as needed for moderate pain or headache.   amoxicillin 500 MG capsule Commonly known as: AMOXIL Take 4 capsules by mouth. Before dental procedure   ASPIRIN 81 PO Take by mouth.   carvedilol 12.5 MG tablet Commonly known as: COREG Take 12.5 mg by mouth 2 (two) times daily with a meal.   Entresto 49-51 MG Generic drug: sacubitril-valsartan Take 1 tablet by mouth 2 (two) times daily.   famotidine 20 MG tablet Commonly known as: PEPCID Take 1 tablet by mouth at bedtime.   insulin glargine 100 UNIT/ML injection Commonly known as: LANTUS Inject 60 Units into the skin at bedtime.   insulin lispro 100 UNIT/ML injection Commonly known as: HUMALOG Inject 22 Units into the skin 3 (three) times daily with meals.   levocetirizine 5 MG tablet Commonly known as: XYZAL Take 5 mg by mouth daily.   levothyroxine 75 MCG tablet Commonly known as: SYNTHROID Take 75 mcg by mouth daily.  pantoprazole 40 MG tablet Commonly known as: PROTONIX Take 40 mg by mouth daily.   rosuvastatin 20 MG tablet Commonly known as: CRESTOR Take 20 mg by mouth daily.   sitaGLIPtin-metformin 50-1000 MG tablet Commonly known as: JANUMET Take 1 tablet by mouth 2 (two) times daily with a meal.   tamsulosin 0.4 MG Caps capsule Commonly known as: FLOMAX Take 1 capsule (0.4 mg total) by mouth daily.       Allergies: No Known Allergies  Family History  Problem Relation Age of Onset    . Stroke Mother   . Colon polyps Neg Hx   . Colon cancer Neg Hx     Social History:  reports that he quit smoking about 22 years ago. His smoking use included cigarettes. He has a 75.00 pack-year smoking history. He has never used smokeless tobacco. He reports that he does not drink alcohol and does not use drugs.  ROS: A complete review of systems was performed.  All systems are negative except for pertinent findings as noted.  Physical Exam:  Vital signs in last 24 hours: There were no vitals taken for this visit. Constitutional:  Alert and oriented, No acute distress Cardiovascular: Regular rate  Respiratory: Normal respiratory effort GI: Abdomen is soft, nontender, nondistended, no abdominal masses. No CVAT.  Genitourinary: Normal male phallus, testes are descended bilaterally and non-tender and without masses, scrotum is normal in appearance without lesions or masses, perineum is normal on inspection. Lymphatic: No lymphadenopathy Neurologic: Grossly intact, no focal deficits Psychiatric: Normal mood and affect   Cystoscopy Procedure Note:  Indication: Follow-up of bladder cancer  After informed consent and discussion of the procedure and its risks, Flavio Lindroth was positioned and prepped in the standard fashion.  Cystoscopy was performed with a flexible cystoscope.   Findings: Urethra: No stricture, no lesions. Prostate: Minimally obstructive Bladder neck: Normal Ureteral orifices: Normal bilaterally Bladder: No urothelial lesions.  No stones.  Washings were taken for cytology  The patient tolerated the procedure well.    I have reviewed prior pt notes  I have reviewed urinalysis/cytology results  I have independently reviewed prior imaging  Impression/Assessment:  History of carcinoma in situ of the bladder, status post immunotherapy with no evidence of recurrence  Probable overactive bladder  Plan:  1.  I will send bladder washings for cytology  2.   I'll have him come back in 6 months for repeat cystoscopy  3.  Overactive bladder guide sheet given patient

## 2019-08-29 NOTE — Addendum Note (Signed)
Addended by: Valentina Lucks on: 08/29/2019 10:35 AM   Modules accepted: Orders

## 2019-08-29 NOTE — Progress Notes (Signed)
Urological Symptom Review  Patient is experiencing the following symptoms: Frequent urination Hard to postpone urination   Review of Systems  Gastrointestinal (upper)  : Negative for upper GI symptoms  Gastrointestinal (lower) : Negative for lower GI symptoms  Constitutional : Negative for symptoms  Skin: Negative for skin symptoms  Eyes: Negative for eye symptoms  Ear/Nose/Throat : Negative for Ear/Nose/Throat symptoms  Hematologic/Lymphatic: Negative for Hematologic/Lymphatic symptoms  Cardiovascular : Negative for cardiovascular symptoms  Respiratory : Shortness of breath  Endocrine: Negative for endocrine symptoms  Musculoskeletal: Negative for musculoskeletal symptoms  Neurological: Negative for neurological symptoms  Psychologic: Negative for psychiatric symptoms

## 2019-08-30 LAB — CYTOLOGY - NON PAP

## 2019-09-01 ENCOUNTER — Telehealth: Payer: Self-pay

## 2019-09-01 NOTE — Telephone Encounter (Signed)
-----   Message from Franchot Gallo, MD sent at 08/31/2019  5:40 PM EDT ----- Notify pt cytology nml ----- Message ----- From: Irine Seal, MD Sent: 08/31/2019   8:27 AM EDT To: Franchot Gallo, MD, #  Negative cytology but this is Dahlstedt's patient.

## 2019-09-01 NOTE — Telephone Encounter (Signed)
Pt's wife notified.

## 2019-10-03 ENCOUNTER — Ambulatory Visit: Payer: Medicare Other | Admitting: Gastroenterology

## 2020-01-02 ENCOUNTER — Emergency Department (HOSPITAL_COMMUNITY)
Admission: EM | Admit: 2020-01-02 | Discharge: 2020-01-02 | Disposition: A | Discharge status: Home / Self Care | Payer: Medicare Other | Attending: Emergency Medicine | Admitting: Emergency Medicine

## 2020-01-02 ENCOUNTER — Encounter (HOSPITAL_COMMUNITY): Payer: Self-pay | Admitting: Emergency Medicine

## 2020-01-02 ENCOUNTER — Emergency Department (HOSPITAL_COMMUNITY): Payer: Medicare Other

## 2020-01-02 ENCOUNTER — Other Ambulatory Visit: Payer: Self-pay

## 2020-01-02 DIAGNOSIS — R531 Weakness: Secondary | ICD-10-CM | POA: Diagnosis not present

## 2020-01-02 DIAGNOSIS — Z20822 Contact with and (suspected) exposure to covid-19: Secondary | ICD-10-CM | POA: Insufficient documentation

## 2020-01-02 DIAGNOSIS — Z5321 Procedure and treatment not carried out due to patient leaving prior to being seen by health care provider: Secondary | ICD-10-CM | POA: Insufficient documentation

## 2020-01-02 DIAGNOSIS — R059 Cough, unspecified: Secondary | ICD-10-CM | POA: Insufficient documentation

## 2020-01-02 LAB — RESP PANEL BY RT-PCR (FLU A&B, COVID) ARPGX2
Influenza A by PCR: NEGATIVE
Influenza B by PCR: NEGATIVE
SARS Coronavirus 2 by RT PCR: NEGATIVE

## 2020-01-02 LAB — BASIC METABOLIC PANEL
Anion gap: 10 (ref 5–15)
BUN: 16 mg/dL (ref 8–23)
CO2: 27 mmol/L (ref 22–32)
Calcium: 9.2 mg/dL (ref 8.9–10.3)
Chloride: 99 mmol/L (ref 98–111)
Creatinine, Ser: 0.96 mg/dL (ref 0.61–1.24)
GFR, Estimated: >60 mL/min (ref >60)
Glucose, Bld: 151 mg/dL — ABNORMAL HIGH (ref 70–99)
Potassium: 4.5 mmol/L (ref 3.5–5.1)
Sodium: 136 mmol/L (ref 135–145)

## 2020-01-02 LAB — CBC
HCT: 39.9 % (ref 39.0–52.0)
Hemoglobin: 12.9 g/dL — ABNORMAL LOW (ref 13.0–17.0)
MCH: 29.6 pg (ref 26.0–34.0)
MCHC: 32.3 g/dL (ref 30.0–36.0)
MCV: 91.5 fL (ref 80.0–100.0)
Platelets: 210 10*3/uL (ref 150–400)
RBC: 4.36 MIL/uL (ref 4.22–5.81)
RDW: 12.9 % (ref 11.5–15.5)
WBC: 12.7 10*3/uL — ABNORMAL HIGH (ref 4.0–10.5)
nRBC: 0 % (ref 0.0–0.2)

## 2020-01-02 NOTE — ED Notes (Signed)
Pt and pt wife inquired with screener about covid results and number in line for room. Pt reports "im diabetic and I can't wait any longer, I will come back tomorrow." Pt and pt family consoled regarding remaining wait time, ED process, and is third in line for room. Pt and pt family stated, "we will just leave and come back tomorrow." ED Charge and Screeners aware.

## 2020-01-02 NOTE — ED Triage Notes (Signed)
Pt c/o a cough and weakness for the past 3 days.  Denies covid exposure.

## 2020-01-03 ENCOUNTER — Ambulatory Visit
Admission: EM | Admit: 2020-01-03 | Discharge: 2020-01-03 | Disposition: A | Discharge status: Home / Self Care | Payer: Medicare Other | Attending: Family Medicine | Admitting: Family Medicine

## 2020-01-03 ENCOUNTER — Encounter: Payer: Self-pay | Admitting: Emergency Medicine

## 2020-01-03 DIAGNOSIS — J069 Acute upper respiratory infection, unspecified: Secondary | ICD-10-CM

## 2020-01-03 DIAGNOSIS — J3489 Other specified disorders of nose and nasal sinuses: Secondary | ICD-10-CM | POA: Diagnosis not present

## 2020-01-03 DIAGNOSIS — D649 Anemia, unspecified: Secondary | ICD-10-CM

## 2020-01-03 DIAGNOSIS — D72829 Elevated white blood cell count, unspecified: Secondary | ICD-10-CM | POA: Diagnosis not present

## 2020-01-03 DIAGNOSIS — R059 Cough, unspecified: Secondary | ICD-10-CM | POA: Diagnosis not present

## 2020-01-03 MED ORDER — AMOXICILLIN-POT CLAVULANATE 875-125 MG PO TABS: 1.0000 | ORAL_TABLET | Freq: Two times a day (BID) | ORAL | 0 refills | Status: AC

## 2020-01-03 MED ORDER — BENZONATATE 100 MG PO CAPS: 100.0000 mg | ORAL_CAPSULE | Freq: Three times a day (TID) | ORAL | 0 refills | Status: DC

## 2020-01-03 NOTE — Discharge Instructions (Addendum)
I am going to treat you for an upper respiratory infection today.  Your white blood cell count was elevated in the hospital yesterday.  Your x-ray from the hospital was negative for pneumonia yesterday.  Your Covid test was negative yesterday.  I have sent in Augmentin for you to take twice a day for 7 days.  I have also sent in [facility] for you to take 1 capsule every 8 hours as needed for cough.  Follow up with this office or with primary care if symptoms are persisting.  Follow up in the ER for high fever, trouble swallowing, trouble breathing, other concerning symptoms.

## 2020-01-03 NOTE — ED Provider Notes (Signed)
[facility]    CSN: [id number] Arrival date & time: 01/03/20  [id number]      History   Chief Complaint Chief Complaint  Patient presents with  . Cough    HPI Michael Key is a 79 y.o. male.   Reports that he has been having cough, congestion and feeling fatigue and weakness for the last 4 days. Reports that he went to the ER at [facility] yesterday for this, but left due to wait times. Has not attempted OTC treatment for this. Symptoms are aggravated by activity. There are not alleviating factors. Has negative hx Covid. Has not received Covid vaccines. Has not received flu vaccine this year. Denies known sick contacts. Denies headache, nausea, vomiting, diarrhea, rash, fever, other symptoms.   ROS per HPI  The history is provided by the patient.    Past Medical History:  Diagnosis Date  . AICD (automatic cardioverter/defibrillator) present   . Arthritis   . Coronary artery disease   . Diabetes ([place]) 06/04/2016  . Dyspnea   . Essential hypertension, benign 06/04/2016  . GERD (gastroesophageal reflux disease) 06/04/2016  . High cholesterol   . History of kidney stones   . HOH (hard of hearing)   . Kidney stones 06/04/2016  . Myocardial infarction ([place]) 1999  . Presence of permanent cardiac pacemaker     Patient Active Problem List   Diagnosis Date Noted  . Diarrhea 07/28/2019  . Fecal incontinence 07/28/2019  . GERD (gastroesophageal reflux disease) 06/04/2016  . Essential hypertension, benign 06/04/2016  . High cholesterol 06/04/2016  . Diabetes ([place]) 06/04/2016  . Kidney stones 06/04/2016  . Gastroesophageal reflux disease without esophagitis 06/04/2016    Past Surgical History:  Procedure Laterality Date  . ANAL FISSURE REPAIR  1968  . BIOPSY  06/22/2016   Procedure: BIOPSY;  Surgeon: Rogene Houston, MD;  [facility];  Service: Endoscopy;;  gastric  . CARDIAC DEFIBRILLATOR PLACEMENT    . CORONARY ARTERY BYPASS GRAFT     04/2015  .  ESOPHAGOGASTRODUODENOSCOPY N/A 06/22/2016   Procedure: ESOPHAGOGASTRODUODENOSCOPY (EGD);  Surgeon: Rogene Houston, MD;  [facility];  Service: Endoscopy;  Laterality: N/A;  7:30  . INSERT / REPLACE / REMOVE PACEMAKER    . JOINT REPLACEMENT Left 2017  . REPLACEMENT TOTAL KNEE     left 2017  . TRANSURETHRAL RESECTION OF BLADDER TUMOR N/A 10/26/2017   Procedure: TRANSURETHRAL RESECTION OF BLADDER TUMOR (TURBT);  Surgeon: Franchot Gallo, MD;  Location: [facility] ORS;  Service: Urology;  Laterality: N/A;       Home Medications    Prior to Admission medications   Medication Sig Start Date End Date Taking? Authorizing Provider  amoxicillin-clavulanate (AUGMENTIN) 875-125 MG tablet Take 1 tablet by mouth 2 (two) times daily for 7 days. 01/03/20 01/10/20 Yes Faustino Congress, NP  benzonatate (TESSALON) 100 MG capsule Take 1 capsule (100 mg total) by mouth every 8 (eight) hours. 01/03/20  Yes Faustino Congress, NP  acetaminophen (TYLENOL) 500 MG tablet Take 1,000 mg by mouth every 6 (six) hours as needed for moderate pain or headache.     [provider]  amoxicillin (AMOXIL) 500 MG capsule Take 4 capsules by mouth. Before dental procedure 07/18/19   [provider]  ASPIRIN 81 PO Take by mouth.    [provider]  carvedilol (COREG) 12.5 MG tablet Take 12.5 mg by mouth 2 (two) times daily with a meal.     [provider]  ENTRESTO [phone]  MG Take 1 tablet by mouth 2 (two) times daily. 07/25/19   [provider]  famotidine (PEPCID) 20 MG tablet Take 1 tablet by mouth at bedtime. 07/01/19   [provider]  insulin glargine (LANTUS) 100 UNIT/ML injection Inject 60 Units into the skin at bedtime.     [provider]  insulin lispro (HUMALOG) 100 UNIT/ML injection Inject 22 Units into the skin 3 (three) times daily with meals.     [provider]  levocetirizine (XYZAL) 5 MG tablet Take 5 mg by mouth daily.    [provider]  levothyroxine (SYNTHROID) 75 MCG tablet Take 75 mcg by mouth daily. 07/03/19   [provider]  pantoprazole (PROTONIX) 40 MG tablet Take 40 mg by mouth daily.    [provider]  rosuvastatin (CRESTOR) 20 MG tablet Take 20 mg by mouth daily.    [provider]  sitaGLIPtin-metformin (JANUMET) 50-1000 MG tablet Take 1 tablet by mouth 2 (two) times daily with a meal.    [provider]  tamsulosin (FLOMAX) 0.4 MG CAPS capsule Take 1 capsule (0.4 mg total) by mouth daily. 01/04/19   Franchot Gallo, MD    Family History Family History  Problem Relation Age of Onset  . Stroke Mother   . Colon polyps Neg Hx   . Colon cancer Neg Hx     Social History Social History   Tobacco Use  . Smoking status: Former Smoker    Packs/day: 2.50    Years: 30.00    Pack years: 75.00    Types: Cigarettes    Quit date: 1999    Years since quitting: 23.0  . Smokeless tobacco: Never Used  Vaping Use  . Vaping Use: Never used  Substance Use Topics  . Alcohol use: No  . Drug use: No     Allergies   Patient has no known allergies.   Review of Systems Review of Systems   Physical Exam Triage Vital Signs ED Triage Vitals  Enc Vitals Group     BP 01/03/20 1005 114/63     Pulse Rate 01/03/20 1005 82     Resp 01/03/20 1005 19     Temp 01/03/20 1005 98 F (36.7 C)     Temp Source 01/03/20 1005 Oral     SpO2 01/03/20 1005 91 %     Weight 01/03/20 1004 204 lb (92.5 kg)     Height 01/03/20 1004 6' (1.829 m)     Head Circumference --      Peak Flow --      Pain Score 01/03/20 1004 0     Pain Loc --      Pain Edu? --      Excl. in [place]? --    No data found.  Updated Vital Signs BP 114/63 (BP Location: Right Arm)   Pulse 82   Temp 98 F (36.7 C) (Oral)   Resp 19   Ht 6' (1.829 m)   Wt 204 lb (92.5 kg)   SpO2 91%   BMI 27.67 kg/m   Visual Acuity Right Eye Distance:   Left Eye Distance:   Bilateral Distance:    Right Eye  Near:   Left Eye Near:    Bilateral Near:     Physical Exam Vitals and nursing note reviewed.  Constitutional:      General: He is not in acute distress.    Appearance: He is well-developed and well-nourished. He is ill-appearing.  HENT:  Head: Normocephalic and atraumatic.     Right Ear: Tympanic membrane normal.     Left Ear: Tympanic membrane normal.     Nose: Congestion and rhinorrhea present.     Mouth/Throat:     Mouth: Mucous membranes are moist.     Pharynx: Oropharynx is clear. Posterior oropharyngeal erythema present.     Comments: Cobblestoning present Eyes:     Extraocular Movements: Extraocular movements intact.     Conjunctiva/sclera: Conjunctivae normal.     Pupils: Pupils are equal, round, and reactive to light.  Cardiovascular:     Rate and Rhythm: Normal rate and regular rhythm.     Heart sounds: Normal heart sounds. No murmur heard.   Pulmonary:     Effort: Pulmonary effort is normal. No respiratory distress.     Breath sounds: Normal breath sounds. No stridor. No rales.  Abdominal:     Palpations: Abdomen is soft.     Tenderness: There is no abdominal tenderness.  Musculoskeletal:        General: No edema. Normal range of motion.     Cervical back: Normal range of motion and neck supple.  Skin:    General: Skin is warm and dry.     Capillary Refill: Capillary refill takes less than 2 seconds.  Neurological:     General: No focal deficit present.     Mental Status: He is alert and oriented to person, place, and time. Mental status is at baseline.  Psychiatric:        Mood and Affect: Mood and affect and mood normal.        Behavior: Behavior normal.        Thought Content: Thought content normal.      UC Treatments / Results  Labs (all labs ordered are listed, but only abnormal results are displayed) Labs Reviewed - No data to display  EKG   Radiology No results found.  Procedures Procedures (including critical care  time)  Medications Ordered in UC Medications - No data to display  Initial Impression / Assessment and Plan / UC Course  I have reviewed the triage vital signs and the nursing notes.  Pertinent labs & imaging results that were available during my care of the patient were reviewed by me and considered in my medical decision making (see chart for details).    URI Cough Nasal congestion Rhinorrhea Leukocytosis  Respiratory viral panel was negative in the hospital upon chart review and reviewing the notes and labs from the visit yesterday Chest x-ray from yesterday in the ER shows "streaky left bibasilar subsegmental atelectasis or scarring.  No acute pulmonary findings." CBC shows leukocytosis with white blood cell count at 12.7 and anemia with hemoglobin at 12.9 Encouraged iron supplement as well We will treat for URI given leukocytosis Prescribed Augmentin twice daily x7 days Prescribed Tessalon Perles for cough Follow-up this office or with primary care as needed Follow-up with the ER for acute worsening symptoms, high fever, trouble swallowing, trouble breathing, other concerning symptoms Verbalized understanding is in agreement with treatment plan Final Clinical Impressions(s) / UC Diagnoses   Final diagnoses:  Cough  Upper respiratory tract infection, unspecified type  Rhinorrhea  Leukocytosis, unspecified type  Anemia, unspecified type     Discharge Instructions     I am going to treat you for an upper respiratory infection today.  Your white blood cell count was elevated in the hospital yesterday.  Your x-ray from the hospital was negative for pneumonia yesterday.  Your Covid test was negative yesterday.  I have sent in Augmentin for you to take twice a day for 7 days.  I have also sent in [facility] for you to take 1 capsule every 8 hours as needed for cough.  Follow up with this office or with primary care if symptoms are persisting.  Follow up in the ER  for high fever, trouble swallowing, trouble breathing, other concerning symptoms.     ED Prescriptions    Medication Sig Dispense Auth. Provider   amoxicillin-clavulanate (AUGMENTIN) 875-125 MG tablet Take 1 tablet by mouth 2 (two) times daily for 7 days. 14 tablet Faustino Congress, NP   benzonatate (TESSALON) 100 MG capsule Take 1 capsule (100 mg total) by mouth every 8 (eight) hours. 21 capsule Faustino Congress, NP     PDMP not reviewed this encounter.   Faustino Congress, NP 01/06/20 1056

## 2020-01-03 NOTE — ED Triage Notes (Signed)
Cough, congestion and weakness started christmas eve.  Pt seen at [facility] ED yesterday. Had chest xray, covid test and blood work.  Pt left d/t long wait.

## 2020-01-09 ENCOUNTER — Other Ambulatory Visit: Payer: Self-pay

## 2020-01-09 DIAGNOSIS — C679 Malignant neoplasm of bladder, unspecified: Secondary | ICD-10-CM

## 2020-01-09 MED ORDER — TAMSULOSIN HCL 0.4 MG PO CAPS: 0.4000 mg | ORAL_CAPSULE | Freq: Every day | ORAL | 3 refills | Status: DC

## 2020-02-27 ENCOUNTER — Other Ambulatory Visit: Payer: Medicare Other | Admitting: Urology

## 2020-03-04 NOTE — Progress Notes (Signed)
History of Present Illness: Pt here for surveillance of bladder cancer.  10.22.2019: TURBT/bladder biopsy. Pathology revealed carcinoma in situ.  2.25.2020: He completed 6 induction instillations of BCG on 1.3.2020. Cystoscopy c/w BCG reaction, cytology revealed atypia/inflamation.  3.24.2020: Completed 1st maintenance BCG (3 Rxs).  6.30.2020: Patient returns for follow up cystoscopy following two maintenances of BCG (third delayed due to [place]). Cysto nml.  8.4.2020: Completed 2nd BCG maintenance rx.  9.15.2020: Cystoscopy negative. 12.15.2020:Completed maintenanceBCG #3 2.23.2021: Cystoscopy/cytology negative. 8.24.2021: Cysto/cytology negative  3.1.2022: No recent urinary issues.  He denies gross hematuria.  Past Medical History:  Diagnosis Date  . AICD (automatic cardioverter/defibrillator) present   . Arthritis   . Coronary artery disease   . Diabetes ([place]) 06/04/2016  . Dyspnea   . Essential hypertension, benign 06/04/2016  . GERD (gastroesophageal reflux disease) 06/04/2016  . High cholesterol   . History of kidney stones   . HOH (hard of hearing)   . Kidney stones 06/04/2016  . Myocardial infarction ([place]) 1999  . Presence of permanent cardiac pacemaker     Past Surgical History:  Procedure Laterality Date  . ANAL FISSURE REPAIR  1968  . BIOPSY  06/22/2016   Procedure: BIOPSY;  Surgeon: Rogene Houston, MD;  [facility];  Service: Endoscopy;;  gastric  . CARDIAC DEFIBRILLATOR PLACEMENT    . CORONARY ARTERY BYPASS GRAFT     04/2015  . ESOPHAGOGASTRODUODENOSCOPY N/A 06/22/2016   Procedure: ESOPHAGOGASTRODUODENOSCOPY (EGD);  Surgeon: Rogene Houston, MD;  [facility];  Service: Endoscopy;  Laterality: N/A;  7:30  . INSERT / REPLACE / REMOVE PACEMAKER    . JOINT REPLACEMENT Left 2017  . REPLACEMENT TOTAL KNEE     left 2017  . TRANSURETHRAL RESECTION OF BLADDER TUMOR N/A 10/26/2017   Procedure: TRANSURETHRAL RESECTION OF BLADDER TUMOR (TURBT);   Surgeon: Franchot Gallo, MD;  Location: [facility];  Service: Urology;  Laterality: N/A;    Home Medications:  Allergies as of 03/05/2020   No Known Allergies     Medication List       Accurate as of March 04, 2020  9:05 PM. If you have any questions, ask your nurse or doctor.        acetaminophen 500 MG tablet Commonly known as: TYLENOL Take 1,000 mg by mouth every 6 (six) hours as needed for moderate pain or headache.   amoxicillin 500 MG capsule Commonly known as: AMOXIL Take 4 capsules by mouth. Before dental procedure   ASPIRIN 81 PO Take by mouth.   benzonatate 100 MG capsule Commonly known as: TESSALON Take 1 capsule (100 mg total) by mouth every 8 (eight) hours.   carvedilol 12.5 MG tablet Commonly known as: COREG Take 12.5 mg by mouth 2 (two) times daily with a meal.   Entresto 49-51 MG Generic drug: sacubitril-valsartan Take 1 tablet by mouth 2 (two) times daily.   famotidine 20 MG tablet Commonly known as: PEPCID Take 1 tablet by mouth at bedtime.   insulin glargine 100 UNIT/ML injection Commonly known as: LANTUS Inject 60 Units into the skin at bedtime.   insulin lispro 100 UNIT/ML injection Commonly known as: HUMALOG Inject 22 Units into the skin 3 (three) times daily with meals.   levocetirizine 5 MG tablet Commonly known as: XYZAL Take 5 mg by mouth daily.   levothyroxine 75 MCG tablet Commonly known as: SYNTHROID Take 75 mcg by mouth daily.   pantoprazole 40 MG tablet Commonly known as: PROTONIX Take 40 mg by  mouth daily.   rosuvastatin 20 MG tablet Commonly known as: CRESTOR Take 20 mg by mouth daily.   sitaGLIPtin-metformin 50-1000 MG tablet Commonly known as: JANUMET Take 1 tablet by mouth 2 (two) times daily with a meal.   tamsulosin 0.4 MG Caps capsule Commonly known as: FLOMAX Take 1 capsule (0.4 mg total) by mouth daily.       Allergies: No Known Allergies  Family History  Problem Relation Age of Onset  . Stroke  Mother   . Colon polyps Neg Hx   . Colon cancer Neg Hx     Social History:  reports that he quit smoking about 23 years ago. His smoking use included cigarettes. He has a 75.00 pack-year smoking history. He has never used smokeless tobacco. He reports that he does not drink alcohol and does not use drugs.  ROS: A complete review of systems was performed.  All systems are negative except for pertinent findings as noted.  Physical Exam:  Vital signs in last 24 hours: There were no vitals taken for this visit. Constitutional:  Alert and oriented, No acute distress Cardiovascular: Regular rate  Respiratory: Normal respiratory effort GI: Abdomen is soft, nontender, nondistended, no abdominal masses. No CVAT.  Lymphatic: No lymphadenopathy Neurologic: Grossly intact, no focal deficits Psychiatric: Normal mood and affect  Cystoscopy Procedure Note:  Indication: Surveillance of bladder cancer  After informed consent and discussion of the procedure and its risks, Michael Key was positioned and prepped in the standard fashion.  Cystoscopy was performed with a flexible cystoscope.   Findings: Urethra: Normal, no stricture Prostate: Mildly obstructive, bilobar hypertrophy Bladder neck: Normal Ureteral orifices: Normally placed, normal configuration Bladder: No urothelial abnormalities  The patient tolerated the procedure well.  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

## 2020-03-05 ENCOUNTER — Other Ambulatory Visit: Payer: Self-pay

## 2020-03-05 ENCOUNTER — Ambulatory Visit (INDEPENDENT_AMBULATORY_CARE_PROVIDER_SITE_OTHER): Payer: Medicare Other | Admitting: Urology

## 2020-03-05 ENCOUNTER — Encounter: Payer: Self-pay | Admitting: Urology

## 2020-03-05 VITALS — BP 149/73 | HR 79 | Temp 98.3°F | Ht 72.0 in | Wt 205.0 lb

## 2020-03-05 DIAGNOSIS — D090 Carcinoma in situ of bladder: Secondary | ICD-10-CM

## 2020-03-05 DIAGNOSIS — C679 Malignant neoplasm of bladder, unspecified: Secondary | ICD-10-CM

## 2020-03-05 NOTE — Addendum Note (Signed)
Addended by: Valentina Lucks on: 03/05/2020 03:17 PM   Modules accepted: Orders

## 2020-03-05 NOTE — Progress Notes (Signed)

## 2020-03-07 LAB — CYTOLOGY - NON PAP

## 2020-03-26 ENCOUNTER — Other Ambulatory Visit: Payer: Medicare Other | Admitting: Urology

## 2020-03-26 NOTE — Progress Notes (Signed)
Results mailed 

## 2020-10-16 ENCOUNTER — Ambulatory Visit (INDEPENDENT_AMBULATORY_CARE_PROVIDER_SITE_OTHER): Payer: Medicare Other | Admitting: Gastroenterology

## 2020-10-16 ENCOUNTER — Encounter: Payer: Self-pay | Admitting: Gastroenterology

## 2020-10-16 VITALS — BP 122/56 | HR 68 | Ht 72.0 in | Wt 217.0 lb

## 2020-10-16 DIAGNOSIS — K529 Noninfective gastroenteritis and colitis, unspecified: Secondary | ICD-10-CM

## 2020-10-16 DIAGNOSIS — K589 Irritable bowel syndrome without diarrhea: Secondary | ICD-10-CM | POA: Diagnosis not present

## 2020-10-16 NOTE — Progress Notes (Signed)
10/16/2020 Michael Key [id number] 29-Feb-1940   HISTORY OF PRESENT ILLNESS: This is a 80 year old male whose care was established with Dr. Ardis Hughs back in July 2021 when he was seen by Carl Best, one of our nurse practitioners.  That was the only other time he has been to our office.  He was seen then for the same complaints that he is here for today.  He is here today with his wife.  They report issues with very urgent diarrhea with associated incontinence that has been occurring for the past several years.  She says that she has known him for 7 years and it has been occurring that entire time that she has known him.  It typically always occurs in the morning within a couple hours after eating.  It only occurs once a day.  It does affect his quality of life.  Once again there is urgency and usually results in incontinence.  Then he does not tend to have any other bowel movements throughout the day. He does not wake up at night with bowel movements.  They have tried eliminating several things from his diet and it has not made any significant difference except he is adamant that cantaloupe tends to trigger these episodes.  This has been deemed likely irritable bowel.  He denies any abdominal pain or rectal bleeding.  He actually has been taking an Imodium daily for the past week or so and has not had an episode of this in the past week or so.  This has been suggested to them in the past.  Colonoscopy February 2016 by Dr. Earley Brooke showed hemorrhoids, diverticulosis, and 3 polyps that were removed.  Pathology said adenomatous and inflamed adenomatous polyps.   Past Medical History:  Diagnosis Date   AICD (automatic cardioverter/defibrillator) present    Arthritis    Coronary artery disease    Diabetes ([place]) 06/04/2016   Dyspnea    Essential hypertension, benign 06/04/2016   GERD (gastroesophageal reflux disease) 06/04/2016   High cholesterol    History of kidney stones    HOH (hard  of hearing)    Kidney stones 06/04/2016   Myocardial infarction ([place]) 1999   Presence of permanent cardiac pacemaker    Past Surgical History:  Procedure Laterality Date   [place]   BIOPSY  06/22/2016   Procedure: BIOPSY;  Surgeon: Rogene Houston, MD;  [facility];  Service: Endoscopy;;  gastric   CARDIAC DEFIBRILLATOR PLACEMENT     CORONARY ARTERY BYPASS GRAFT     04/2015   ESOPHAGOGASTRODUODENOSCOPY N/A 06/22/2016   Procedure: ESOPHAGOGASTRODUODENOSCOPY (EGD);  Surgeon: Rogene Houston, MD;  [facility];  Service: Endoscopy;  Laterality: N/A;  7:30   INSERT / REPLACE / REMOVE PACEMAKER     JOINT REPLACEMENT Left 2017   REPLACEMENT TOTAL KNEE     left 2017   TRANSURETHRAL RESECTION OF BLADDER TUMOR N/A 10/26/2017   Procedure: TRANSURETHRAL RESECTION OF BLADDER TUMOR (TURBT);  Surgeon: Franchot Gallo, MD;  Location: [facility];  Service: Urology;  Laterality: N/A;    reports that he quit smoking about 23 years ago. His smoking use included cigarettes. He has a 75.00 pack-year smoking history. He has never used smokeless tobacco. He reports that he does not drink alcohol and does not use drugs. family history includes Stroke in his mother. No Known Allergies    Outpatient Encounter Medications as of 10/16/2020  Medication Sig   acetaminophen (TYLENOL)  500 MG tablet Take 1,000 mg by mouth every 6 (six) hours as needed for moderate pain or headache.    amoxicillin (AMOXIL) 500 MG capsule Take 4 capsules by mouth. Before dental procedure   ASPIRIN 81 PO Take by mouth.   benzonatate (TESSALON) 100 MG capsule Take 1 capsule (100 mg total) by mouth every 8 (eight) hours.   carvedilol (COREG) 12.5 MG tablet Take 12.5 mg by mouth 2 (two) times daily with a meal.    ENTRESTO 49-51 MG Take 1 tablet by mouth 2 (two) times daily.   famotidine (PEPCID) 20 MG tablet Take 1 tablet by mouth at bedtime.   insulin glargine (LANTUS) 100 UNIT/ML injection  Inject 60 Units into the skin at bedtime.    insulin lispro (HUMALOG) 100 UNIT/ML injection Inject 22 Units into the skin 3 (three) times daily with meals.    levocetirizine (XYZAL) 5 MG tablet Take 5 mg by mouth daily.   levothyroxine (SYNTHROID) 75 MCG tablet Take 75 mcg by mouth daily.   pantoprazole (PROTONIX) 40 MG tablet Take 40 mg by mouth daily.   rosuvastatin (CRESTOR) 20 MG tablet Take 20 mg by mouth daily.   sitaGLIPtin-metformin (JANUMET) 50-1000 MG tablet Take 1 tablet by mouth 2 (two) times daily with a meal.   tamsulosin (FLOMAX) 0.4 MG CAPS capsule Take 1 capsule (0.4 mg total) by mouth daily.   No facility-administered encounter medications on file as of 10/16/2020.     REVIEW OF SYSTEMS  : All other systems reviewed and negative except where noted in the History of Present Illness.   PHYSICAL EXAM: BP (!) 122/56   Pulse 68   Ht 6' (1.829 m)   Wt 217 lb (98.4 kg)   BMI 29.43 kg/m  General: Well developed white male in no acute distress Head: Normocephalic and atraumatic Eyes:  Sclerae anicteric, conjunctiva pink. Ears: Normal auditory acuity Lungs: Clear throughout to auscultation; no W/R/R. Heart: Regular rate and rhythm; no M/R/G. Abdomen: Soft, non-distended.  BS present.  Non-tender. Musculoskeletal: Symmetrical with no gross deformities  Skin: No lesions on visible extremities Extremities: No edema  Neurological: Alert oriented x 4, grossly non-focal Psychological:  Alert and cooperative. 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  The diarrhea typically only occurs with 1 episode in the mornings.  There is a lot of urgency with these episodes and often results in incontinence.  They have been unable to identify any specific food triggers except he is adamant that cantaloupe causes his symptoms.  They have tried eliminating dairy and other things as well, but not much of  it has made a difference.  He is currently taking 1 Imodium daily according to his wife and so far he has not had an episode in about a week.  This has been suggested to them in the past.  She was advised that she could titrate the dosing up or down depending on his symptoms and response.  I think that the way they are continuing is fine for now.  I do not see any need to change it if it is working.  They can follow-up as needed.   CC:  Pomposini, Cherly Anderson, MD

## 2020-10-16 NOTE — Patient Instructions (Signed)
Continue Imodium (Loperamide) 2 mg daily.   FODmap Diet information provided.  Follow up as needed.  If you are age 80 or older, your body mass index should be between 23-30. Your Body mass index is 29.43 kg/m. If this is out of the aforementioned range listed, please consider follow up with your Primary Care Provider.  If you are age 43 or younger, your body mass index should be between 19-25. Your Body mass index is 29.43 kg/m. If this is out of the aformentioned range listed, please consider follow up with your Primary Care Provider.   __________________________________________________________  The [facility] providers would like to encourage you to use [facility] to communicate with providers for non-urgent requests or questions.  Due to long hold times on the telephone, sending your provider a message by [facility] may be a faster and more efficient way to get a response.  Please allow 48 business hours for a response.  Please remember that this is for non-urgent requests.

## 2020-10-17 NOTE — Progress Notes (Signed)
I agree with the above note, plan 

## 2021-02-10 ENCOUNTER — Other Ambulatory Visit: Payer: Self-pay | Admitting: Urology

## 2021-02-10 DIAGNOSIS — C679 Malignant neoplasm of bladder, unspecified: Secondary | ICD-10-CM

## 2021-03-11 ENCOUNTER — Ambulatory Visit: Payer: BLUE CROSS/BLUE SHIELD | Admitting: Urology

## 2021-03-11 ENCOUNTER — Other Ambulatory Visit: Payer: Self-pay

## 2021-03-11 ENCOUNTER — Ambulatory Visit (INDEPENDENT_AMBULATORY_CARE_PROVIDER_SITE_OTHER): Payer: Medicare Other | Admitting: Urology

## 2021-03-11 VITALS — BP 122/64 | HR 83 | Ht 72.0 in | Wt 217.0 lb

## 2021-03-11 DIAGNOSIS — D090 Carcinoma in situ of bladder: Secondary | ICD-10-CM

## 2021-03-11 DIAGNOSIS — C679 Malignant neoplasm of bladder, unspecified: Secondary | ICD-10-CM

## 2021-03-11 DIAGNOSIS — N3281 Overactive bladder: Secondary | ICD-10-CM

## 2021-03-11 MED ORDER — CIPROFLOXACIN HCL 500 MG PO TABS
500.0000 mg | ORAL_TABLET | Freq: Once | ORAL | Status: AC
  Administered 2021-03-11: 500 mg via ORAL

## 2021-03-11 NOTE — Progress Notes (Signed)
History of Present Illness: Here for follow-up of bladder cancer. ? ?10.22.2019: TURBT/bladder biopsy. Pathology revealed carcinoma in situ.  ?2.25.2020: He completed 6 induction instillations of BCG on 1.3.2020. Cystoscopy c/w BCG reaction, cytology revealed atypia/inflamation.  ?3.24.2020: Completed 1st maintenance BCG (3 Rxs).  ?6.30.2020: Patient returns for follow up cystoscopy following two maintenances of BCG (third delayed due to [place]). Cysto nml.  ?8.4.2020: Completed 2nd BCG maintenance rx.  ?9.15.2020: Cystoscopy negative. ?12.15.2020: Completed maintenance BCG #3 ?2.23.2021: Cystoscopy/cytology negative. ?8.24.2021: Cysto/cytology negative ? 3.1.2022: Cysto negative. Cytology--atypical cells ? ?3.7.2023: He has had no gross hematuria or other urologic issues over the past year other than frequency and urgency. ? ?Past Medical History:  ?Diagnosis Date  ? AICD (automatic cardioverter/defibrillator) present   ? Arthritis   ? Coronary artery disease   ? Diabetes ([place]) 06/04/2016  ? Dyspnea   ? Essential hypertension, benign 06/04/2016  ? GERD (gastroesophageal reflux disease) 06/04/2016  ? High cholesterol   ? History of kidney stones   ? HOH (hard of hearing)   ? Kidney stones 06/04/2016  ? Myocardial infarction [facility]) 1999  ? Presence of permanent cardiac pacemaker   ? ? ?Past Surgical History:  ?Procedure Laterality Date  ? [place]  ? BIOPSY  06/22/2016  ? Procedure: BIOPSY;  Surgeon: Rogene [place], MD;  Location: [facility];  Service: Endoscopy;;  gastric  ? CARDIAC DEFIBRILLATOR PLACEMENT    ? CORONARY ARTERY BYPASS GRAFT    ? 04/2015  ? ESOPHAGOGASTRODUODENOSCOPY N/A 06/22/2016  ? Procedure: ESOPHAGOGASTRODUODENOSCOPY (EGD);  Surgeon: Rogene [place], MD;  Location: [facility];  Service: Endoscopy;  Laterality: N/A;  7:30  ? INSERT / REPLACE / REMOVE PACEMAKER    ? JOINT REPLACEMENT Left 2017  ? REPLACEMENT TOTAL KNEE    ? left 2017  ? TRANSURETHRAL RESECTION OF BLADDER TUMOR N/A  10/26/2017  ? Procedure: TRANSURETHRAL RESECTION OF BLADDER TUMOR (TURBT);  Surgeon: Franchot Gallo, MD;  Location: [facility] OR[facility];  Service: Urology;  Laterality: N/A;  ? ? ?Home Medications:  ?Allergies as of 03/11/2021   ?No Known Allergies ?  ? ?  ?Medication List  ?  ? ?  ? Accurate as of March 11, 2021  8:14 AM. If you have any questions, ask your nurse or doctor.  ?  ?  ? ?  ? ?acetaminophen 500 MG tablet ?Commonly known as: TYLENOL ?Take 1,000 mg by mouth every 6 (six) hours as needed for moderate pain or headache. ?  ?amoxicillin 500 MG capsule ?Commonly known as: AMOXIL ?Take 4 capsules by mouth. Before dental procedure ?  ?ASPIRIN 81 PO ?Take by mouth. ?  ?benzonatate 100 MG capsule ?Commonly known as: TESSALON ?Take 1 capsule (100 mg total) by mouth every 8 (eight) hours. ?  ?carvedilol 12.5 MG tablet ?Commonly known as: COREG ?Take 12.5 mg by mouth 2 (two) times daily with a meal. ?  ?Entresto 49-51 MG ?Generic drug: sacubitril-valsartan ?Take 1 tablet by mouth 2 (two) times daily. ?  ?famotidine 20 MG tablet ?Commonly known as: PEPCID ?Take 1 tablet by mouth at bedtime. ?  ?insulin glargine 100 UNIT/ML injection ?Commonly known as: LANTUS ?Inject 60 Units into the skin at bedtime. ?  ?insulin lispro 100 UNIT/ML injection ?Commonly known as: HUMALOG ?Inject 22 Units into the skin 3 (three) times daily with meals. ?  ?levocetirizine 5 MG tablet ?Commonly known as: XYZAL ?Take 5 mg by mouth daily. ?  ?levothyroxine 75 MCG tablet ?Commonly known as:  SYNTHROID ?Take 75 mcg by mouth daily. ?  ?pantoprazole 40 MG tablet ?Commonly known as: PROTONIX ?Take 40 mg by mouth daily. ?  ?rosuvastatin 20 MG tablet ?Commonly known as: CRESTOR ?Take 20 mg by mouth daily. ?  ?sitaGLIPtin-metformin 50-1000 MG tablet ?Commonly known as: JANUMET ?Take 1 tablet by mouth 2 (two) times daily with a meal. ?  ?tamsulosin 0.4 MG Caps capsule ?Commonly known as: FLOMAX ?Take 1 capsule by mouth once daily ?  ? ?  ? ? ?Allergies: No Known  Allergies ? ?Family History  ?Problem Relation Age of Onset  ? Stroke Mother   ? Colon polyps Neg Hx   ? Colon cancer Neg Hx   ? ? ?Social History:  reports that he quit smoking about 24 years ago. His smoking use included cigarettes. He has a 75.00 pack-year smoking history. He has never used smokeless tobacco. He reports that he does not drink alcohol and does not use drugs. ? ?ROS: ?A complete review of systems was performed.  All systems are negative except for pertinent findings as noted. ? ?Physical Exam:  ?Vital signs in last 24 hours: ?There were no vitals taken for this visit. ?Constitutional:  Alert and oriented, No acute distress ?Cardiovascular: Regular rate  ?Respiratory: Normal respiratory effort ?Psychiatric: Normal mood and affect ? ?I have reviewed prior pt notes ? ?I have reviewed notes from referring/previous physicians ? ?I have reviewed urinalysis results ? ?Cytology reviewed ? ?Cystoscopy Procedure Note: ? ?Indication: Surveillance for treated bladder cancer ? ?After informed consent and discussion of the procedure and its risks, Michael Key was positioned and prepped in the standard fashion.  ?Cystoscopy was performed with a flexible cystoscope.   ?Findings: ?Urethra: Normal, no lesions ?Prostate: Nonobstructive ?Bladder neck: Normal ?Ureteral orifices: Normal in location, configuration ?Bladder: No foreign bodies, trabeculations, tumors ? ?The patient tolerated the procedure well. ? ?  ? ? ?Impression/Assessment:  ?History of CIS/bladder cancer, completed BCG induction and maintenance in 2020, no evidence of recurrence ? ?Plan:  ?I will see back in a year for cystoscopy ? ?

## 2021-05-15 ENCOUNTER — Other Ambulatory Visit: Payer: Self-pay | Admitting: Urology

## 2021-05-15 DIAGNOSIS — C679 Malignant neoplasm of bladder, unspecified: Secondary | ICD-10-CM

## 2021-11-15 IMAGING — DX DG CHEST 2V
2 series · 2 of 2 positions shown · non-contrast
Comparison: None.

CLINICAL DATA: Cough and weakness.

EXAM:
CHEST - 2 VIEW

[chest pa]
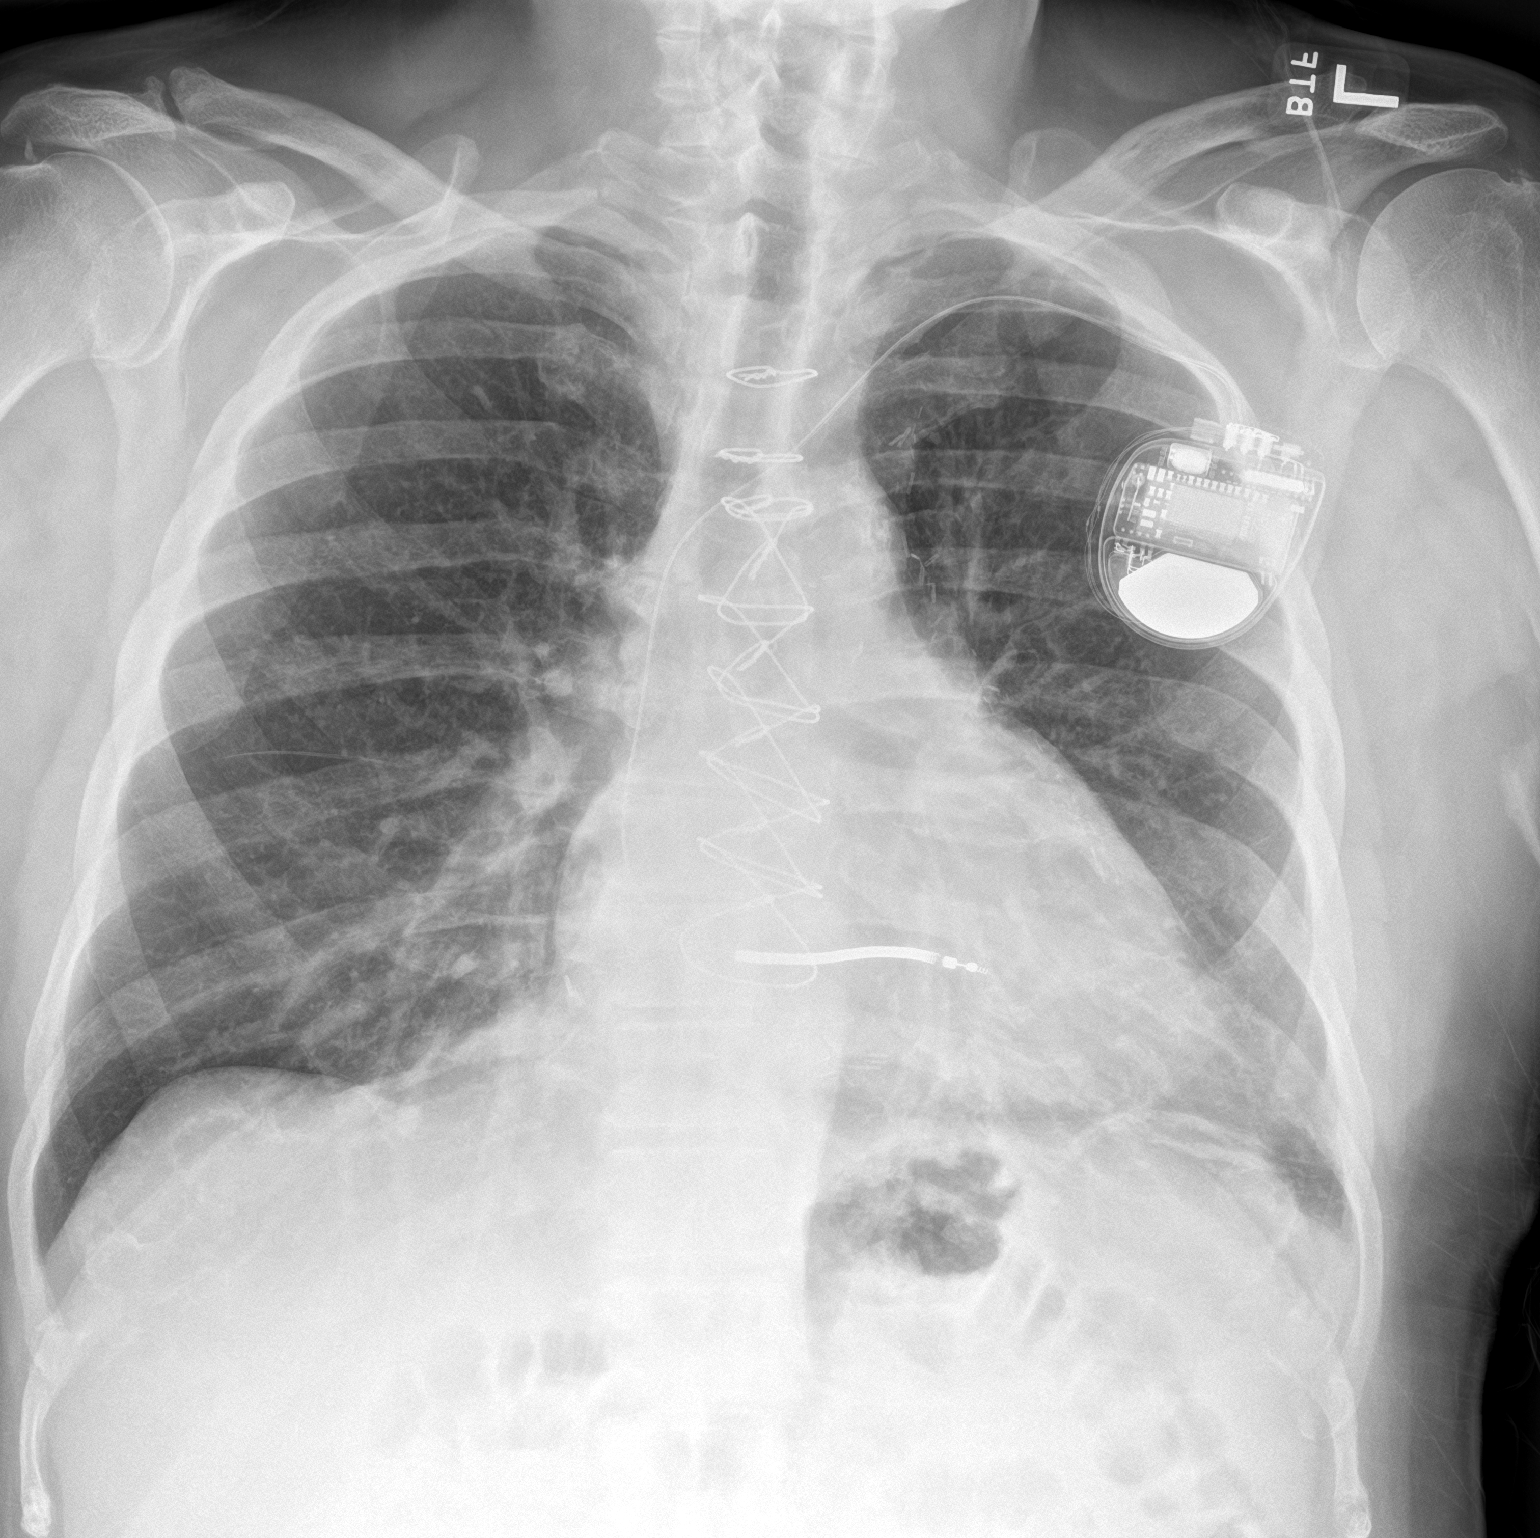

[chest lat]
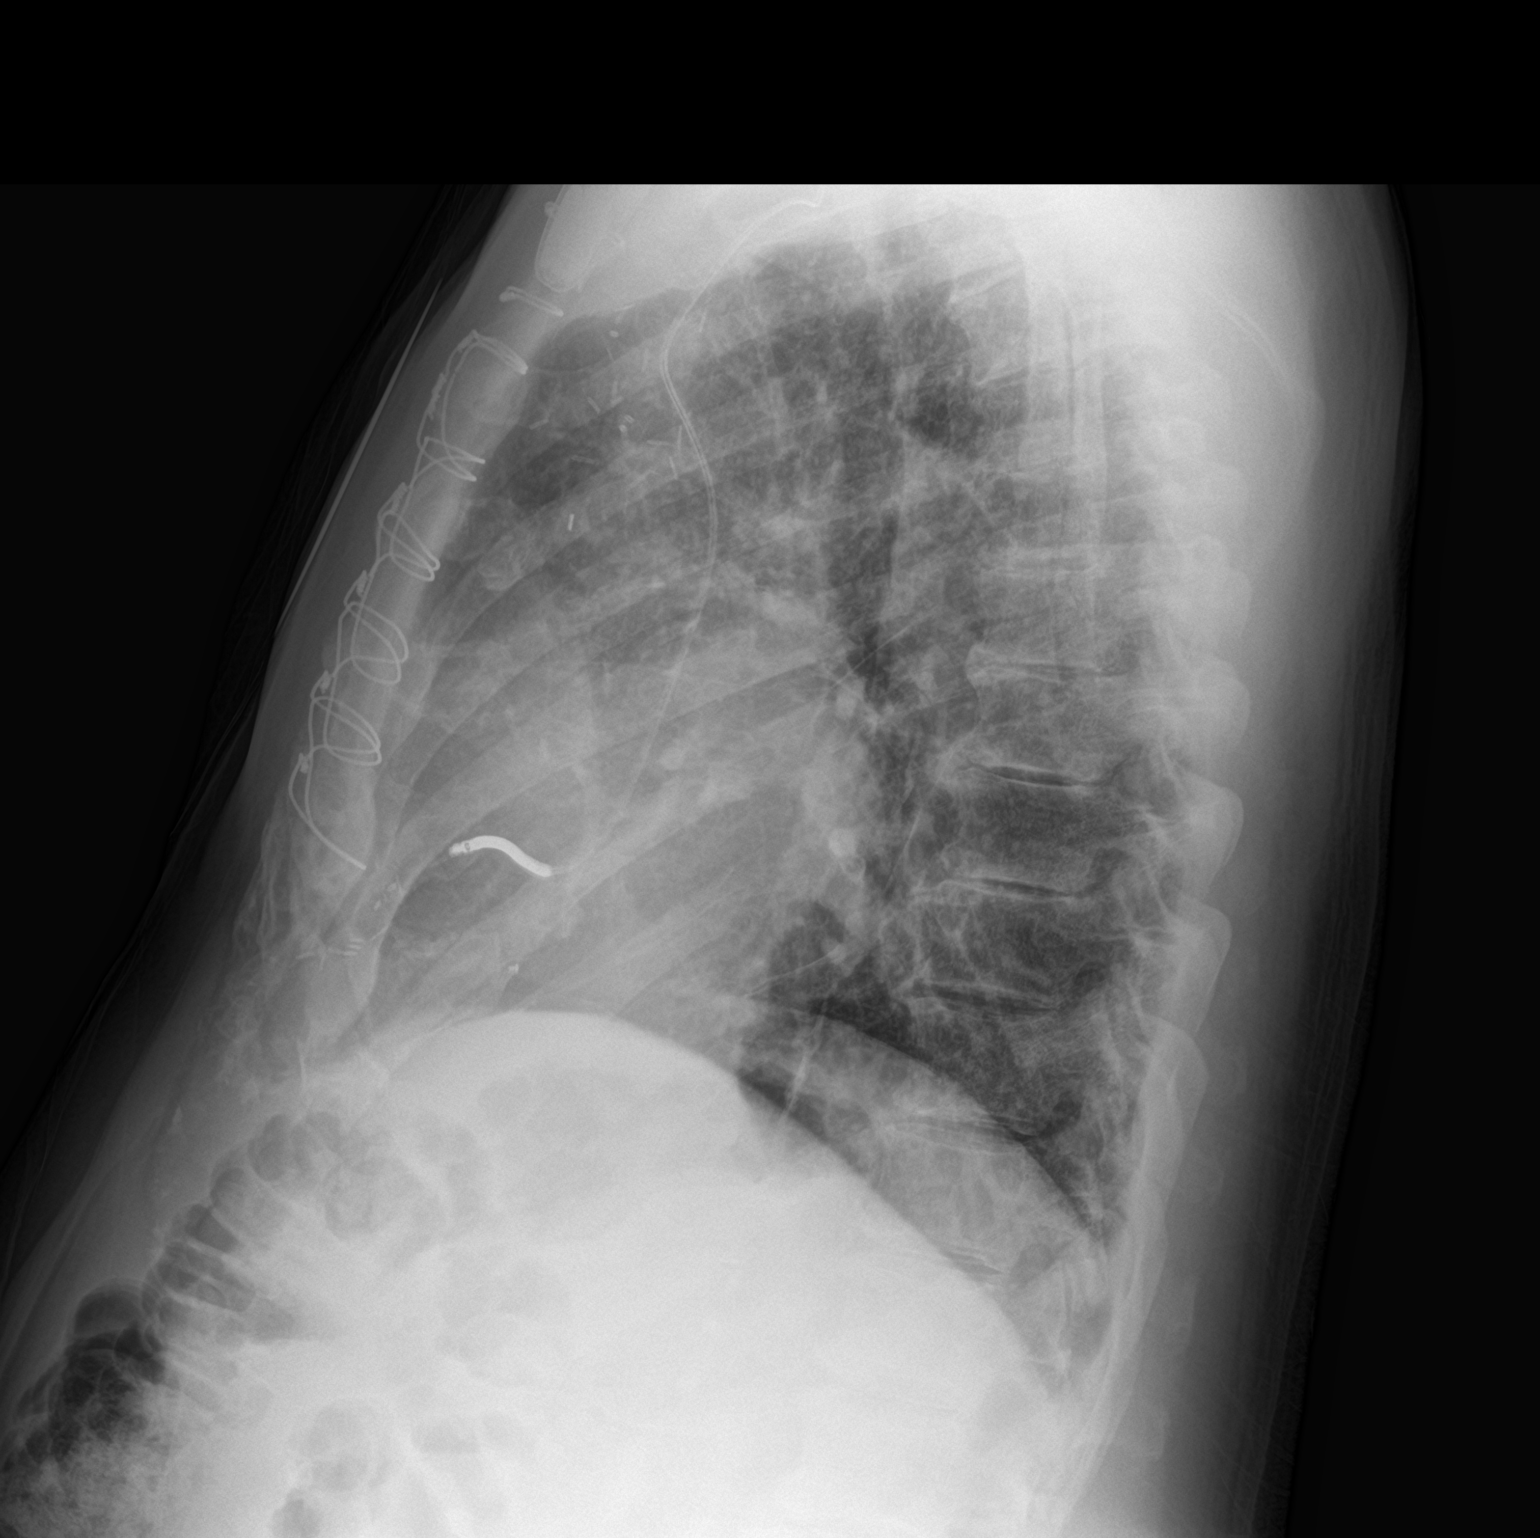

[2 of 2 positions shown; findings below may reference images not displayed]

FINDINGS: The right ventricular pacer wire/AICD is in good position.

Stable surgical changes from bypass surgery.

Streaky left basilar subsegmental atelectasis or scarring. No
infiltrates, edema or effusions. No worrisome pulmonary lesions. The
bony thorax is intact.
IMPRESSION: Streaky left basilar subsegmental atelectasis or scarring. No acute
pulmonary findings.

## 2022-02-08 NOTE — Progress Notes (Unsigned)
Michael Key, male    DOB: Aug 24, 1940   MRN: [id number]   Brief patient profile:  65 yowm quit 1999  referred to pulmonary clinic 02/09/2022 by Dr Harmon Pier  for copd evaluation with acute cough/ sob > admit Michael Key  dx copd.       History of Present Illness  02/09/2022  Pulmonary/ 1st office eval/Michael Key  Chief Complaint  Patient presents with   Consult    COPD diagnosis January 2024.  Dyspnea.  Dyspnea:  uses handicap parking/ concerned re balance issues so very inactive x years  Cough: mucus is white / acutely worse for a few days prior to admit and still somewhat congested / much better on prednisone and worse off  Sleep: bed  is flat/ one pillow resp cc not really bothering sleep  SABA use: none  - has inhalers but does not know how to use them  No obvious day to day or daytime pattern/variability or assoc  purulent sputum or mucus plugs or hemoptysis or cp or chest tightness, subjective wheeze or overt sinus or hb symptoms.    Also denies any obvious fluctuation of symptoms with weather or environmental changes or other aggravating or alleviating factors except as outlined above   No unusual exposure hx or h/o childhood pna/ asthma or knowledge of premature birth.  Current Allergies, Complete Past Medical History, Past Surgical History, Family History, and Social History were reviewed in [employer] record.  ROS  The following are not active complaints unless bolded Hoarseness, sore throat, dysphagia, dental problems, itching, sneezing,  nasal congestion or discharge of excess mucus or purulent secretions, ear ache,   fever, chills, sweats, unintended wt loss or wt gain, classically pleuritic or exertional cp,  orthopnea pnd or arm/hand swelling  or leg swelling, presyncope, palpitations, abdominal pain, anorexia, nausea, vomiting, diarrhea  or change in bowel habits or change in bladder habits, change in stools or change in urine, dysuria, hematuria,   rash, arthralgias, visual complaints, headache, numbness, weakness or ataxia or problems with walking or coordination,  change in mood or  memory.           Past Medical History:  Diagnosis Date   AICD (automatic cardioverter/defibrillator) present    Arthritis    Coronary artery disease    Diabetes ([place]) 06/04/2016   Dyspnea    Essential hypertension, benign 06/04/2016   GERD (gastroesophageal reflux disease) 06/04/2016   High cholesterol    History of kidney stones    HOH (hard of hearing)    Kidney stones 06/04/2016   Myocardial infarction ([place]) 1999   Presence of permanent cardiac pacemaker     Outpatient Medications Prior to Visit  Medication Sig Dispense Refill   acetaminophen (TYLENOL) 500 MG tablet Take 1,000 mg by mouth every 6 (six) hours as needed for moderate pain or headache.      albuterol (VENTOLIN HFA) 108 (90 Base) MCG/ACT inhaler Inhale 2 puffs into the lungs every 4 (four) hours as needed for wheezing or shortness of breath.     amoxicillin (AMOXIL) 500 MG capsule Take 4 capsules by mouth. Before dental procedure     carvedilol (COREG) 25 MG tablet Per instructions TWICE DAILY (route: oral)     ENTRESTO 49-51 MG Take 1 tablet by mouth 2 (two) times daily.     escitalopram (LEXAPRO) 20 MG tablet Per instructions ONCE DAILY (route: oral)     famotidine (PEPCID) 20 MG tablet Per instructions ONCE DAILY (route: oral)  insulin glargine (LANTUS) 100 UNIT/ML injection Inject 60 Units into the skin at bedtime.      insulin lispro (HUMALOG) 100 UNIT/ML injection Inject 22 Units into the skin 3 (three) times daily with meals.      levocetirizine (XYZAL) 5 MG tablet Take 5 mg by mouth daily.     levothyroxine (SYNTHROID) 75 MCG tablet Take 75 mcg by mouth daily.     pantoprazole (PROTONIX) 40 MG tablet Take 40 mg by mouth daily.     rosuvastatin (CRESTOR) 20 MG tablet Take 20 mg by mouth daily.     Sacubitril-Valsartan (ENTRESTO PO) Per instructions TWICE DAILY (route:  oral)     sitaGLIPtin (JANUVIA) 100 MG tablet Take 100 mg by mouth daily.     Albuterol Sulfate (PROAIR RESPICLICK) 161 (90 Base) MCG/ACT AEPB Take 2 puffs by mouth every 6 (six) hours. (Patient not taking: Reported on 02/09/2022)     ASPIRIN 81 PO Take by mouth. (Patient not taking: Reported on 02/09/2022)     benzonatate (TESSALON) 100 MG capsule Take 1 capsule (100 mg total) by mouth every 8 (eight) hours. (Patient not taking: Reported on 02/09/2022) 21 capsule 0   carvedilol (COREG) 12.5 MG tablet Take 12.5 mg by mouth 2 (two) times daily with a meal.  (Patient not taking: Reported on 02/09/2022)     famotidine (PEPCID) 20 MG tablet Take 1 tablet by mouth at bedtime. (Patient not taking: Reported on 02/09/2022)     Ipratropium-Albuterol (COMBIVENT RESPIMAT IN) 1 puff DAILY (route: inhalation) (Patient not taking: Reported on 02/09/2022)     sitaGLIPtin-metformin (JANUMET) 50-1000 MG tablet Take 1 tablet by mouth 2 (two) times daily with a meal.     tamsulosin (FLOMAX) 0.4 MG CAPS capsule Take 1 capsule by mouth once daily (Patient not taking: Reported on 02/09/2022) 90 capsule 3   No facility-administered medications prior to visit.     Objective:     BP (!) 140/68 (BP Location: Left Arm, Patient Position: Sitting, Cuff Size: Normal)   Pulse 72   Temp 97.7 F (36.5 C) (Oral)   Ht 6' (1.829 m)   Wt 227 lb (103 kg)   SpO2 92%   BMI 30.79 kg/m   SpO2: 92 %  Mod obese pleasant wm nad / congested sounding voluntary cough    HEENT : Oropharynx  clear       NECK :  without  apparent JVD/ palpable Nodes/TM    LUNGS: no acc muscle use,  Min barrel  contour chest wall with bilateral  insp/exp  rhonchi and  without cough on insp or exp maneuvers and min  Hyperresonant  to  percussion bilaterally    CV:  RRR  no s3 or murmur or increase in P2, and no edema   ABD:  soft and nontender with pos end  insp Hoover's  in the supine position.  No bruits or organomegaly appreciated   MS:  ext warm  without deformities Or obvious joint restrictions  calf tenderness, cyanosis or clubbing     SKIN: warm and dry without lesions    NEURO:  alert, approp, nl sensorium with  no motor or cerebellar deficits apparent.       CXR PA and Lateral:   02/09/2022 :    I personally reviewed images and impression is as follows:     Mild cm/ no def as dz      Assessment   Asthmatic bronchitis Onset of symptoms reported acute onset mid Jan 2024 >  admit Key/Michael  - 02/09/2022  After extensive coaching inhaler device,  effectiveness =    75% with SMI from a baseline of 0 despite reporting  rx with combient smi at home    When respiratory symptoms begin or become refractory well after a patient reports complete smoking cessation, especially when this wasn't the case while they were smoking, a red flag is raised based on the work of Dr Kris Mouton which states:  if you quit smoking when your best day FEV1 is still well preserved it is highly unlikely you will progress to severe disease.  That is to say, once the smoking stops,  the symptoms should not suddenly erupt years later (as was the case here)  or markedly worsen.  If so, the differential diagnosis should include  obesity/deconditioning,  LPR/Reflux/Aspiration syndromes,  occult CHF, or  especially side effect of medications commonly used in this population ( in his case Entresto, high doses of Coreg).  Rec: Max rx for gerd Trial of stiolto 2 puffs each am as it's the inhaler least likely to make his cough worse Depomedrol 120 mg IM for any inflammatory component . For cough > mucinex dm 1200 mg every 12 hours as needed   Have requested d/c summary from [facility] and will review this before ordering addition dx studies or considering trial off entresto(substitute valsartan)  or coreg (substitute bisoprolol)          Each maintenance medication was reviewed in detail including emphasizing most importantly the difference between maintenance and  prns and under what circumstances the prns are to be triggered using an action plan format where appropriate.  Total time for H and P, chart review, counseling, reviewing [facility]  device(s) and generating customized AVS unique to this office visit / same day charting = 50 min new pt eval        Christinia Gully, MD 02/09/2022

## 2022-02-09 ENCOUNTER — Telehealth: Payer: Self-pay | Admitting: Internal Medicine

## 2022-02-09 ENCOUNTER — Ambulatory Visit (INDEPENDENT_AMBULATORY_CARE_PROVIDER_SITE_OTHER): Payer: Medicare Other | Admitting: Internal Medicine

## 2022-02-09 ENCOUNTER — Ambulatory Visit (INDEPENDENT_AMBULATORY_CARE_PROVIDER_SITE_OTHER): Payer: Medicare Other

## 2022-02-09 ENCOUNTER — Encounter: Payer: Self-pay | Admitting: Internal Medicine

## 2022-02-09 VITALS — BP 140/68 | HR 72 | Temp 97.7°F | Ht 72.0 in | Wt 227.0 lb

## 2022-02-09 DIAGNOSIS — J4530 Mild persistent asthma, uncomplicated: Secondary | ICD-10-CM | POA: Diagnosis not present

## 2022-02-09 DIAGNOSIS — J45909 Unspecified asthma, uncomplicated: Secondary | ICD-10-CM | POA: Insufficient documentation

## 2022-02-09 MED ORDER — METHYLPREDNISOLONE ACETATE 80 MG/ML IJ SUSP
120.0000 mg | Freq: Once | INTRAMUSCULAR | Status: AC
  Administered 2022-02-09: 120 mg via INTRAMUSCULAR

## 2022-02-09 MED ORDER — FAMOTIDINE 20 MG PO TABS: 20.0000 mg | ORAL_TABLET | Freq: Every day | ORAL | Status: AC

## 2022-02-09 MED ORDER — STIOLTO RESPIMAT 2.5-2.5 MCG/ACT IN AERS: 2.0000 | INHALATION_SPRAY | Freq: Every day | RESPIRATORY_TRACT | 0 refills | Status: DC

## 2022-02-09 NOTE — Assessment & Plan Note (Signed)
Onset of symptoms reported acute onset mid Jan 2024 > admit [place]  - 02/09/2022  After extensive coaching inhaler device,  effectiveness =    75% with SMI from a baseline of 0 despite reporting  rx with combient smi at home    When respiratory symptoms begin or become refractory well after a patient reports complete smoking cessation, especially when this wasn't the case while they were smoking, a red flag is raised based on the work of Dr Kris Mouton which states:  if you quit smoking when your best day FEV1 is still well preserved it is highly unlikely you will progress to severe disease.  That is to say, once the smoking stops,  the symptoms should not suddenly erupt years later (as was the case here)  or markedly worsen.  If so, the differential diagnosis should include  obesity/deconditioning,  LPR/Reflux/Aspiration syndromes,  occult CHF, or  especially side effect of medications commonly used in this population ( in his case Entresto, high doses of Coreg).  Rec: Max rx for gerd Trial of stiolto 2 puffs each am as it's the inhaler least likely to make his cough worse Depomedrol 120 mg IM for any inflammatory component . For cough > mucinex dm 1200 mg every 12 hours as needed   Have requested d/c summary from [facility] and will review this before ordering addition dx studies or considering trial off entresto(substitute valsartan)  or coreg (substitute bisoprolol)          Each maintenance medication was reviewed in detail including emphasizing most importantly the difference between maintenance and prns and under what circumstances the prns are to be triggered using an action plan format where appropriate.  Total time for H and P, chart review, counseling, reviewing [facility]  device(s) and generating customized AVS unique to this office visit / same day charting = 50 min new pt eval

## 2022-02-09 NOTE — Telephone Encounter (Signed)
Called and spoke with wife and she states that Dr Morrison Old schedule is booked out for over a month in [place]. She is asking if the follow up that is needed in 1 week really important or if her husband can wait to see Dr Melvyn Novas again. Wife states that she really does not want to come to [facility] given the [facility] is closer.   Please advise sir

## 2022-02-09 NOTE — Patient Instructions (Signed)
Depomedrol 120 mg IM    Stiolto 2 puffs each am   For cough > mucinex dm 1200 mg every 12 hours as needed   Work on inhaler technique:  relax and gently blow all the way out then take a nice smooth full deep breath back in, triggering the inhaler at same time you start breathing in.  Hold breath in for at least  5 seconds if you can.  Rinse and gargle with water when done.  If mouth or throat bother you at all,  try brushing teeth/gums/tongue with arm and hammer toothpaste/ make a slurry and gargle and spit out.   Please remember to go to the  x-ray department  for your tests - we will call you with the results when they are available     Please schedule a follow up office visit in 1   weeks, sooner if needed [place] office

## 2022-02-09 NOTE — Telephone Encounter (Signed)
We can add him on to Friday pm

## 2022-02-09 NOTE — Telephone Encounter (Signed)
pt is suppose to schedule in [facility] for 1 week per Dr. Melvyn Novas and his nurse. Next available is over an month out . pls advise on what the patient is suppose to do. advised that they may need to come back to [place] but pt wife stated that because [facility] office was discussed and is much closer to them she wanted me to check with Dr. Melvyn Novas about it

## 2022-02-10 ENCOUNTER — Telehealth: Payer: Self-pay | Admitting: Internal Medicine

## 2022-02-10 NOTE — Telephone Encounter (Signed)
Open up a Friday pm slot for him - bring all meds/ inhalers etc

## 2022-02-10 NOTE — Telephone Encounter (Signed)
Called and scheduled appt for patient for next Friday in [facility] office at 1:45 pm. Gave patients wife Lyndee Leo  address and phone number. Nothing further needed

## 2022-02-11 NOTE — Progress Notes (Signed)
Called and spoke with patient's wife, Lyndee Leo [facility]), advised of results/recommendations per Dr. Melvyn Novas.  Verified f/u on Friday 2/16 at 1:45 pm, advised to arrive at 1:30 pm for check in.  She verbalized understanding.  Nothing further needed.

## 2022-02-11 NOTE — Telephone Encounter (Signed)
Spoke with the pt's spouse  She states that Dr Melvyn Novas told her that we needed to get some of pt's records from [facility]  She went there and spoke with medical records and said they refused to give her any records  She is asking if we can call and obtain records  I assume that they will want a release form but have not called them yet  Dr Melvyn Novas, what records were you needing exactly?

## 2022-02-11 NOTE — Telephone Encounter (Signed)
ATC X1 LVM for patient to call the office back 

## 2022-02-11 NOTE — Telephone Encounter (Signed)
We got them yesterday and I sent them to be scanned.  There were no surprises and they did not make any specific dx so we will need to to additional pulmonary w/u as planned at last ov, no change in recs

## 2022-02-13 NOTE — Telephone Encounter (Signed)
Spoke with the pt's spouse and notified of response from Dr Melvyn Novas. She verbalized understanding. Nothing further needed.

## 2022-02-20 ENCOUNTER — Ambulatory Visit (INDEPENDENT_AMBULATORY_CARE_PROVIDER_SITE_OTHER): Payer: Medicare Other | Admitting: Internal Medicine

## 2022-02-20 ENCOUNTER — Ambulatory Visit: Payer: Medicare Other | Admitting: Internal Medicine

## 2022-02-20 ENCOUNTER — Encounter: Payer: Self-pay | Admitting: Internal Medicine

## 2022-02-20 VITALS — BP 96/60 | HR 65 | Ht 72.0 in | Wt 225.0 lb

## 2022-02-20 DIAGNOSIS — J4530 Mild persistent asthma, uncomplicated: Secondary | ICD-10-CM

## 2022-02-20 NOTE — Progress Notes (Unsigned)
Michael Key, male    DOB: September 17, 1940   MRN: [id number]   Brief patient profile:  66 yowm quit smoking 1999  referred to pulmonary clinic 02/09/2022 by Michael Key  for copd evaluation with acute cough/ sob > admit [facility]  dx copd though lung volumes on cxr were decreased and CT chest was nl with pt presenting with very high BP and EF 20-25% BNP reportedly < 10 on Entresto and coreg .       History of Present Illness  02/09/2022  Pulmonary/ 1st office eval/Michael Key  Chief Complaint  Patient presents with   Consult    COPD diagnosis January 2024.  Dyspnea.  Dyspnea:  uses handicap parking/ concerned re balance issues so very inactive x years  Cough: mucus is white / acutely worse for a few days prior to admit and still somewhat congested / much better on prednisone and worse off  Sleep: bed  is flat/ one pillow resp cc not really bothering sleep  SABA use: none  - has inhalers but does not know how to use them Rec Depomedrol 120 mg IM   Stiolto 2 puffs each am  For cough > mucinex dm 1200 mg every 12 hours as needed  Work on inhaler technique: Cxr: ok    02/20/2022  f/u ov/Michael Key office/Michael Key re: doe maint on no resp rx  /still on [employer] Complaint  Patient presents with   Follow-up    Pt f/u he states that his breathing is better than LOV. He states the Stiolto helped him but they had some difficulty operating the inhaler  Dyspnea:  limited by balance not breathing  Cough: better p prednisone or depomedrol  Sleeping: on back bed is flat no cough  SABA use: not using  02: not using  Covid status: x 3      No obvious day to day or daytime variability or assoc excess/ purulent sputum or mucus plugs or hemoptysis or cp or chest tightness, subjective wheeze or overt sinus or hb symptoms.   Sleeping  without nocturnal  or early am exacerbation  of respiratory  c/o's or need for noct saba. Also denies any obvious fluctuation of symptoms with weather or environmental  changes or other aggravating or alleviating factors except as outlined above   No unusual exposure hx or h/o childhood pna/ asthma or knowledge of premature birth.  Current Allergies, Complete Past Medical History, Past Surgical History, Family History, and Social History were reviewed in [employer] record.  ROS  The following are not active complaints unless bolded Hoarseness, sore throat (globus sensation) dysphagia, dental problems, itching, sneezing,  nasal congestion or discharge of excess mucus or purulent secretions, ear ache,   fever, chills, sweats, unintended wt loss or wt gain, classically pleuritic or exertional cp,  orthopnea pnd or arm/hand swelling  or leg swelling, presyncope, palpitations, abdominal pain, anorexia, nausea, vomiting, diarrhea  or change in bowel habits or change in bladder habits, change in stools or change in urine, dysuria, hematuria,  rash, arthralgias, visual complaints, headache, numbness, weakness or ataxia or problems with walking or coordination,  change in mood or  memory.        Current Meds  Medication Sig   acetaminophen (TYLENOL) 500 MG tablet Take 1,000 mg by mouth every 6 (six) hours as needed for moderate pain or headache.    albuterol (VENTOLIN HFA) 108 (90 Base) MCG/ACT inhaler Inhale 2 puffs into the lungs every 4 (four) hours  as needed for wheezing or shortness of breath.   amoxicillin (AMOXIL) 500 MG capsule Take 4 capsules by mouth. Before dental procedure   carvedilol (COREG) 25 MG tablet Per instructions TWICE DAILY (route: oral)   ENTRESTO 49-51 MG Take 1 tablet by mouth 2 (two) times daily.   escitalopram (LEXAPRO) 20 MG tablet Per instructions ONCE DAILY (route: oral)   famotidine (PEPCID) 20 MG tablet Take 1 tablet (20 mg total) by mouth at bedtime.   insulin glargine (LANTUS) 100 UNIT/ML injection Inject 60 Units into the skin at bedtime.    insulin lispro (HUMALOG) 100 UNIT/ML injection Inject 22 Units into the  skin 3 (three) times daily with meals.    levocetirizine (XYZAL) 5 MG tablet Take 5 mg by mouth daily.   levothyroxine (SYNTHROID) 75 MCG tablet Take 75 mcg by mouth daily.   pantoprazole (PROTONIX) 40 MG tablet Take 40 mg by mouth daily.   rosuvastatin (CRESTOR) 20 MG tablet Take 20 mg by mouth daily.   Sacubitril-Valsartan (ENTRESTO PO) Per instructions TWICE DAILY (route: oral)   sitaGLIPtin (JANUVIA) 100 MG tablet Take 100 mg by mouth daily.   Tiotropium Bromide-Olodaterol (STIOLTO RESPIMAT) 2.5-2.5 MCG/ACT AERS Inhale 2 puffs into the lungs daily.                    Past Medical History:  Diagnosis Date   AICD (automatic cardioverter/defibrillator) present    Arthritis    Coronary artery disease    Diabetes ([place]) 06/04/2016   Dyspnea    Essential hypertension, benign 06/04/2016   GERD (gastroesophageal reflux disease) 06/04/2016   High cholesterol    History of kidney stones    HOH (hard of hearing)    Kidney stones 06/04/2016   Myocardial infarction ([place]) 1999   Presence of permanent cardiac pacemaker        Objective:    Wt Readings from Last 3 Encounters:  02/20/22 225 lb (102.1 kg)  02/09/22 227 lb (103 kg)  03/11/21 217 lb (98.4 kg)      Vital signs reviewed  02/20/2022  - Note at rest 02 sats  97% on RA   General appearance:    stoic hoarse wm with throat clearing and pseudowheeze on exam     HEENT : Oropharynx  clear      NECK :  without  apparent JVD/ palpable Nodes/TM    LUNGS: no acc muscle use,  Min barrel  contour chest wall with bilateral  slightly decreased bs s audible wheeze and  without cough on insp or exp maneuvers and min  Hyperresonant  to  percussion bilaterally    CV:  RRR  no s3 or murmur or increase in P2, and no edema   ABD:  soft and nontender with pos end  insp Hoover's  in the supine position.  No bruits or organomegaly appreciated   MS:  Nl gait/ ext warm without deformities Or obvious joint restrictions  calf tenderness,  cyanosis or clubbing     SKIN: warm and dry without lesions    NEURO:  alert, approp, nl sensorium with  no motor or cerebellar deficits apparent.           Assessment

## 2022-02-20 NOTE — Patient Instructions (Addendum)
GERD (REFLUX)  is an extremely common cause of respiratory symptoms just like yours , many times with no obvious heartburn at all.    It can be treated with medication, but also with lifestyle changes including elevation of the head of your bed (ideally with 6 -8inch blocks under the headboard of your bed),  Smoking cessation, avoidance of late meals, excessive alcohol, and avoid fatty foods, chocolate, peppermint, colas, red wine, and acidic juices such as orange juice.  NO MINT OR MENTHOL PRODUCTS SO NO COUGH DROPS  USE SUGARLESS CANDY INSTEAD (Jolley ranchers or Stover's or Life Savers) or even ice chips will also do - the key is to swallow to prevent all throat clearing. NO OIL BASED VITAMINS - use powdered substitutes.  Avoid fish oil when coughing.   Michael Key may be the cause of your cough (occurs in at least 10 % of patients I see on it)  but I don't recommend it be changed for now - discuss with your heart doctor    Please schedule a follow up visit in 3-4  months but call sooner if needed with PFTs on return (same day)

## 2022-02-21 NOTE — Assessment & Plan Note (Addendum)
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  Upper airway cough syndrome (previously labeled PNDS),  is so named because it's frequently impossible to sort out how much is  CR/sinusitis with freq throat clearing (which can be related to primary GERD)   vs  causing  secondary (" extra esophageal")  GERD from wide swings in gastric pressure that occur with throat clearing, often  promoting self use of mint and menthol lozenges that reduce the lower esophageal sphincter tone and exacerbate the problem further in a cyclical fashion.   These are the same pts (now being labeled as having "irritable larynx syndrome" by some cough centers) who not infrequently have a history of having failed to tolerate ace inhibitors (and now includes entresto) ,  dry powder inhalers or biphosphonates or report having atypical/extraesophageal reflux symptoms that don't respond to standard doses of PPI  and are easily confused as having aecopd or asthma flares by even experienced allergists/ pulmonologists (myself included).   Rec continue max rx for GERD and consider a very carefully monitored trial off entresto and on double dose of valsartan component for a min of 4 weeks to see if cough or breathing change in any way.   NB: The sacubitrilat component of entresto is not and ACEi but it does lead to higher levels of bradykinin (the culprit in ACEi related cough) because it reduces Neprilysin based clearance of bradykinin. The typical symptoms are dry daytime cough (9% per PI) or complaints of a new sensation of globus or excess PNDS which fit this pt to a T  F/u 3-4 months, call sooner  if needed.          Each maintenance medication was reviewed in detail including emphasizing most importantly the difference between maintenance and prns and under what circumstances the prns are to be triggered using an action plan format where appropriate.  Total time for H and P, chart review, counseling, reviewing smi/hfa device(s) and generating customized AVS unique to this office visit / same day charting  > 30 min for refractory respiratory  symptoms of uncertain etiology

## 2022-03-16 NOTE — Progress Notes (Signed)
History of Present Illness: "Michael Key" returns for continued  follow-up of bladder cancer.  10.22.2019: TURBT/bladder biopsy. Pathology revealed carcinoma in situ.  2.25.2020: He completed 6 induction instillations of BCG on 1.3.2020. Cystoscopy c/w BCG reaction, cytology revealed atypia/inflamation.  3.24.2020: Completed 1st maintenance BCG (3 Rxs).  6.30.2020: Patient returns for follow up cystoscopy following two maintenances of BCG (third delayed due to [place]). Cysto nml.  8.4.2020: Completed 2nd BCG maintenance rx.  9.15.2020: Cystoscopy negative. 12.15.2020: Completed maintenance BCG #3 2.23.2021: Cystoscopy/cytology negative. 8.24.2021: Cysto/cytology negative  3.1.2022: Cysto negative. Cytology--atypical cells 3.7.2023: Cysto negative.  3.12.2024: He comes in today for routine cystoscopy.  Only real issue is urgency, frequency and urgency incontinence.  Is wearing pull-ups.  No blood in urine.   Past Medical History:  Diagnosis Date   AICD (automatic cardioverter/defibrillator) present    Arthritis    Coronary artery disease    Diabetes ([place]) 06/04/2016   Dyspnea    Essential hypertension, benign 06/04/2016   GERD (gastroesophageal reflux disease) 06/04/2016   High cholesterol    History of kidney stones    HOH (hard of hearing)    Kidney stones 06/04/2016   Myocardial infarction ([place]) 1999   Presence of permanent cardiac pacemaker     Past Surgical History:  Procedure Laterality Date   [place]   BIOPSY  06/22/2016   Procedure: BIOPSY;  Surgeon: Rogene Houston, MD;  Location: [facility];  Service: Endoscopy;;  gastric   CARDIAC DEFIBRILLATOR PLACEMENT     CORONARY ARTERY BYPASS GRAFT     04/2015   ESOPHAGOGASTRODUODENOSCOPY N/A 06/22/2016   Procedure: ESOPHAGOGASTRODUODENOSCOPY (EGD);  Surgeon: Rogene Houston, MD;  Location: [facility];  Service: Endoscopy;  Laterality: N/A;  7:30   INSERT / REPLACE / REMOVE PACEMAKER     JOINT REPLACEMENT  Left 2017   REPLACEMENT TOTAL KNEE     left 2017   TRANSURETHRAL RESECTION OF BLADDER TUMOR N/A 10/26/2017   Procedure: TRANSURETHRAL RESECTION OF BLADDER TUMOR (TURBT);  Surgeon: Franchot Gallo, MD;  Location: [facility];  Service: Urology;  Laterality: N/A;    Home Medications:  Allergies as of 03/17/2022   No Known Allergies      Medication List        Accurate as of March 16, 2022  9:56 AM. If you have any questions, ask your nurse or doctor.          acetaminophen 500 MG tablet Commonly known as: TYLENOL Take 1,000 mg by mouth every 6 (six) hours as needed for moderate pain or headache.   albuterol 108 (90 Base) MCG/ACT inhaler Commonly known as: VENTOLIN HFA Inhale 2 puffs into the lungs every 4 (four) hours as needed for wheezing or shortness of breath.   amoxicillin 500 MG capsule Commonly known as: AMOXIL Take 4 capsules by mouth. Before dental procedure   carvedilol 25 MG tablet Commonly known as: COREG Per instructions TWICE DAILY (route: oral)   Entresto 49-51 MG Generic drug: sacubitril-valsartan Take 1 tablet by mouth 2 (two) times daily.   ENTRESTO PO Per instructions TWICE DAILY (route: oral)   famotidine 20 MG tablet Commonly known as: PEPCID Take 1 tablet (20 mg total) by mouth at bedtime.   insulin glargine 100 UNIT/ML injection Commonly known as: LANTUS Inject 60 Units into the skin at bedtime.   insulin lispro 100 UNIT/ML injection Commonly known as: HUMALOG Inject 22 Units into the skin 3 (three) times daily with meals.   levocetirizine  5 MG tablet Commonly known as: XYZAL Take 5 mg by mouth daily.   levothyroxine 75 MCG tablet Commonly known as: SYNTHROID Take 75 mcg by mouth daily.   Lexapro 20 MG tablet Generic drug: escitalopram Per instructions ONCE DAILY (route: oral)   pantoprazole 40 MG tablet Commonly known as: PROTONIX Take 40 mg by mouth daily.   rosuvastatin 20 MG tablet Commonly known as: CRESTOR Take 20 mg  by mouth daily.   sitaGLIPtin 100 MG tablet Commonly known as: JANUVIA Take 100 mg by mouth daily.   Stiolto Respimat 2.5-2.5 MCG/ACT Aers Generic drug: Tiotropium Bromide-Olodaterol Inhale 2 puffs into the lungs daily.        Allergies: No Known Allergies  Family History  Problem Relation Age of Onset   Stroke Mother    Colon polyps Neg Hx    Colon cancer Neg Hx     Social History:  reports that he quit smoking about 25 years ago. His smoking use included cigarettes. He has a 75.00 pack-year smoking history. He has never used smokeless tobacco. He reports that he does not drink alcohol and does not use drugs.  ROS: A complete review of systems was performed.  All systems are negative except for pertinent findings as noted.  Physical Exam:  Vital signs in last 24 hours: There were no vitals taken for this visit. Constitutional:  Alert and oriented, No acute distress Cardiovascular: Regular rate  Respiratory: Normal respiratory effort Psychiatric: Normal mood and affect  I have reviewed prior pt notes  I have reviewed urinalysis results  I have reviewed prior cytology results  I have reviewed prior path results  Cystoscopy Procedure Note:  Indication: Bladder cancer surveillance  After informed consent and discussion of the procedure and its risks, [name] was positioned and prepped in the standard fashion.  Cystoscopy was performed with a flexible cystoscope.   Findings: Urethra: No lesions or stricture Prostate: Minimally obstructive Bladder neck: Open Ureteral orifices: Normal bilaterally Bladder: Mild degree of trabeculation.  No urothelial lesions.  Bladder mostly emptied at the time of cystoscopy  The patient tolerated the procedure well.      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  Worsened by his lack of mobility  Plan:  I will have  him come back in a year for recheck with cystoscopy  I did give him samples of Gemtesa for a month.  Patient is on a new medicine ?soltalol--I saw no interaction between [place] and a medicine by this name

## 2022-03-17 ENCOUNTER — Encounter: Payer: Self-pay | Admitting: Urology

## 2022-03-17 ENCOUNTER — Ambulatory Visit (INDEPENDENT_AMBULATORY_CARE_PROVIDER_SITE_OTHER): Payer: Medicare Other | Admitting: Urology

## 2022-03-17 VITALS — BP 118/74 | HR 71

## 2022-03-17 DIAGNOSIS — Z8551 Personal history of malignant neoplasm of bladder: Secondary | ICD-10-CM

## 2022-03-17 LAB — URINALYSIS, ROUTINE W REFLEX MICROSCOPIC
Bilirubin, UA: NEGATIVE
Glucose, UA: NEGATIVE
Ketones, UA: NEGATIVE
Leukocytes,UA: NEGATIVE
Nitrite, UA: NEGATIVE
RBC, UA: NEGATIVE
Specific Gravity, UA: 1.015 (ref 1.005–1.030)
Urobilinogen, Ur: 0.2 mg/dL (ref 0.2–1.0)
pH, UA: 6.5 (ref 5.0–7.5)

## 2022-03-17 MED ORDER — CIPROFLOXACIN HCL 500 MG PO TABS
500.0000 mg | ORAL_TABLET | Freq: Once | ORAL | Status: AC
  Administered 2022-03-17: 500 mg via ORAL

## 2022-03-19 LAB — CYTOLOGY - NON PAP

## 2022-03-23 ENCOUNTER — Telehealth: Payer: Self-pay

## 2022-03-23 NOTE — Telephone Encounter (Signed)
Patient left a voice message.  Needing urine cytology results.  Please advise.

## 2022-03-27 NOTE — Telephone Encounter (Signed)
Tried calling patient with no answer. Left vm for return call 

## 2022-03-31 ENCOUNTER — Other Ambulatory Visit: Payer: Self-pay | Admitting: Urology

## 2022-04-09 NOTE — Telephone Encounter (Signed)
Patient is aware of MD recommendation and patient voiced that pre-op is scheduled at [facility]. Patient voiced understanding

## 2022-04-14 NOTE — Patient Instructions (Signed)
DUE TO COVID-19 ONLY TWO VISITORS  (aged 82 and older)  ARE ALLOWED TO COME WITH YOU AND STAY IN THE WAITING ROOM ONLY DURING PRE OP AND PROCEDURE.   **NO VISITORS ARE ALLOWED IN THE SHORT STAY AREA OR RECOVERY ROOM!!**  IF YOU WILL BE ADMITTED INTO THE HOSPITAL YOU ARE ALLOWED ONLY FOUR SUPPORT PEOPLE DURING VISITATION HOURS ONLY (7 AM -8PM)   The support person(s) must pass our screening, gel in and out, and wear a mask at all times, including in the patient's room. Patients must also wear a mask when staff or their support person are in the room. Visitors GUEST BADGE MUST BE WORN VISIBLY  One adult visitor may remain with you overnight and MUST be in the room by 8 P.M.     Your procedure is scheduled on: 04/23/22   Report to [facility] Main Entrance    Report to admitting at : 7:30 AM   Call this number if you have problems the morning of surgery [phone]   Do not eat food :After Midnight.    Oral Hygiene is also important to reduce your risk of infection.                                    Remember - BRUSH YOUR TEETH THE MORNING OF SURGERY WITH YOUR REGULAR TOOTHPASTE  DENTURES WILL BE REMOVED PRIOR TO SURGERY PLEASE DO NOT APPLY "Poly grip" OR ADHESIVES!!!   Do NOT smoke after Midnight   Take these medicines the morning of surgery with A SIP OF WATER: levocetirizine,carvedilol,sotalol,levothyroxine.  How to Manage Your Diabetes Before and After Surgery  Why is it important to control my blood sugar before and after surgery? Improving blood sugar levels before and after surgery helps healing and can limit problems. A way of improving blood sugar control is eating a healthy diet by:  Eating less sugar and carbohydrates  Increasing activity/exercise  Talking with your doctor about reaching your blood sugar goals High blood sugars (greater than 180 mg/dL) can raise your risk of infections and slow your recovery, so you will need to focus on controlling your  diabetes during the weeks before surgery. Make sure that the doctor who takes care of your diabetes knows about your planned surgery including the date and location.  How do I manage my blood sugar before surgery? Check your blood sugar at least 4 times a day, starting 2 days before surgery, to make sure that the level is not too high or low. Check your blood sugar the morning of your surgery when you wake up and every 2 hours until you get to the Short Stay unit. If your blood sugar is less than 70 mg/dL, you will need to treat for low blood sugar: Do not take insulin. Treat a low blood sugar (less than 70 mg/dL) with  cup of clear juice (cranberry or apple), 4 glucose tablets, OR glucose gel. Recheck blood sugar in 15 minutes after treatment (to make sure it is greater than 70 mg/dL). If your blood sugar is not greater than 70 mg/dL on recheck, call [phone] for further instructions. Report your blood sugar to the short stay nurse when you get to Short Stay.  If you are admitted to the hospital after surgery: Your blood sugar will be checked by the staff and you will probably be given insulin after surgery (instead of oral diabetes medicines) to  make sure you have good blood sugar levels. The goal for blood sugar control after surgery is 80-180 mg/dL.   WHAT DO I DO ABOUT MY DIABETES MEDICATION?  THE NIGHT BEFORE SURGERY, take ONLY half of the lantus insulin dose. DO NOT take the dinner dose of humalog insulin. Take [place] as usual.    THE MORNING OF SURGERY, DO NOT TAKE ANY ORAL DIABETIC MEDICATIONS DAY OF YOUR SURGERY  If your CBG is greater than 220 mg/dL, you may take  of your sliding scale  (correction) dose of insulin.                    You may not have any metal on your body including hair pins, jewelry, and body piercing             Do not wear lotions, powders, perfumes/cologne, or deodorant              Men may shave face and neck.   Do not bring valuables to the  hospital. [facility] IS NOT             RESPONSIBLE   FOR VALUABLES.   Contacts, glasses, or bridgework may not be worn into surgery.   Bring small overnight bag day of surgery.   DO NOT BRING YOUR HOME MEDICATIONS TO THE HOSPITAL. PHARMACY WILL DISPENSE MEDICATIONS LISTED ON YOUR MEDICATION LIST TO YOU DURING YOUR ADMISSION IN THE HOSPITAL!    Patients discharged on the day of surgery will not be allowed to drive home.  Someone NEEDS to stay with you for the first 24 hours after anesthesia.   Special Instructions: Bring a copy of your healthcare power of attorney and living will documents         the day of surgery if you haven't scanned them before.              Please read over the following fact sheets you were given: IF YOU HAVE QUESTIONS ABOUT YOUR PRE-OP INSTRUCTIONS PLEASE CALL [phone]    [facility] - Preparing for Surgery Before surgery, you can play an important role.  Because skin is not sterile, your skin needs to be as free of germs as possible.  You can reduce the number of germs on your skin by washing with CHG (chlorahexidine gluconate) soap before surgery.  CHG is an antiseptic cleaner which kills germs and bonds with the skin to continue killing germs even after washing. Please DO NOT use if you have an allergy to CHG or antibacterial soaps.  If your skin becomes reddened/irritated stop using the CHG and inform your nurse when you arrive at Short Stay. Do not shave (including legs and underarms) for at least 48 hours prior to the first CHG shower.  You may shave your face/neck. Please follow these instructions carefully:  1.  Shower with CHG Soap the night before surgery and the  morning of Surgery.  2.  If you choose to wash your hair, wash your hair first as usual with your  normal  shampoo.  3.  After you shampoo, rinse your hair and body thoroughly to remove the  shampoo.                           4.  Use CHG as you would any other liquid soap.  You can apply chg  directly  to the skin and wash  Gently with a scrungie or clean washcloth.  5.  Apply the CHG Soap to your body ONLY FROM THE NECK DOWN.   Do not use on face/ open                           Wound or open sores. Avoid contact with eyes, ears mouth and genitals (private parts).                       Wash face,  Genitals (private parts) with your normal soap.             6.  Wash thoroughly, paying special attention to the area where your surgery  will be performed.  7.  Thoroughly rinse your body with warm water from the neck down.  8.  DO NOT shower/wash with your normal soap after using and rinsing off  the CHG Soap.                9.  Pat yourself dry with a clean towel.            10.  Wear clean pajamas.            11.  Place clean sheets on your bed the night of your first shower and do not  sleep with pets. Day of Surgery : Do not apply any lotions/deodorants the morning of surgery.  Please wear clean clothes to the hospital/surgery center.  FAILURE TO FOLLOW THESE INSTRUCTIONS MAY RESULT IN THE CANCELLATION OF YOUR SURGERY PATIENT SIGNATURE_________________________________  NURSE SIGNATURE__________________________________  ________________________________________________________________________

## 2022-04-15 ENCOUNTER — Other Ambulatory Visit: Payer: Self-pay

## 2022-04-15 ENCOUNTER — Encounter (HOSPITAL_COMMUNITY): Payer: Self-pay

## 2022-04-15 ENCOUNTER — Encounter (HOSPITAL_COMMUNITY)
Admission: RE | Admit: 2022-04-15 | Discharge: 2022-04-15 | Disposition: A | Discharge status: Home / Self Care | Payer: Medicare Other | Source: Ambulatory Visit | Attending: Urology | Admitting: Urology

## 2022-04-15 VITALS — BP 124/59 | HR 61 | Temp 98.1°F | Ht 72.0 in | Wt 228.0 lb

## 2022-04-15 DIAGNOSIS — I10 Essential (primary) hypertension: Secondary | ICD-10-CM

## 2022-04-15 DIAGNOSIS — E119 Type 2 diabetes mellitus without complications: Secondary | ICD-10-CM | POA: Diagnosis not present

## 2022-04-15 DIAGNOSIS — Z794 Long term (current) use of insulin: Secondary | ICD-10-CM | POA: Insufficient documentation

## 2022-04-15 DIAGNOSIS — Z95810 Presence of automatic (implantable) cardiac defibrillator: Secondary | ICD-10-CM | POA: Insufficient documentation

## 2022-04-15 DIAGNOSIS — I2510 Atherosclerotic heart disease of native coronary artery without angina pectoris: Secondary | ICD-10-CM | POA: Insufficient documentation

## 2022-04-15 DIAGNOSIS — K219 Gastro-esophageal reflux disease without esophagitis: Secondary | ICD-10-CM | POA: Insufficient documentation

## 2022-04-15 DIAGNOSIS — Z01812 Encounter for preprocedural laboratory examination: Secondary | ICD-10-CM | POA: Insufficient documentation

## 2022-04-15 DIAGNOSIS — J449 Chronic obstructive pulmonary disease, unspecified: Secondary | ICD-10-CM | POA: Insufficient documentation

## 2022-04-15 DIAGNOSIS — Z8551 Personal history of malignant neoplasm of bladder: Secondary | ICD-10-CM | POA: Insufficient documentation

## 2022-04-15 DIAGNOSIS — Z87891 Personal history of nicotine dependence: Secondary | ICD-10-CM | POA: Diagnosis not present

## 2022-04-15 HISTORY — DX: Fatty (change of) liver, not elsewhere classified: K76.0

## 2022-04-15 HISTORY — DX: Pneumonia, unspecified organism: J18.9

## 2022-04-15 HISTORY — DX: Malignant (primary) neoplasm, unspecified: C80.1

## 2022-04-15 LAB — GLUCOSE, CAPILLARY: Glucose-Capillary: 153 mg/dL — ABNORMAL HIGH (ref 70–99)

## 2022-04-15 LAB — HEMOGLOBIN A1C
Hgb A1c MFr Bld: 7.5 % — ABNORMAL HIGH (ref 4.8–5.6)
Mean Plasma Glucose: 168.55 mg/dL

## 2022-04-15 LAB — CBC
HCT: 39.3 % (ref 39.0–52.0)
Hemoglobin: 13.1 g/dL (ref 13.0–17.0)
MCH: 30 pg (ref 26.0–34.0)
MCHC: 33.3 g/dL (ref 30.0–36.0)
MCV: 90.1 fL (ref 80.0–100.0)
Platelets: 211 10*3/uL (ref 150–400)
RBC: 4.36 MIL/uL (ref 4.22–5.81)
RDW: 13.5 % (ref 11.5–15.5)
WBC: 9.5 10*3/uL (ref 4.0–10.5)
nRBC: 0 % (ref 0.0–0.2)

## 2022-04-15 NOTE — Progress Notes (Addendum)
For Short Stay: COVID SWAB appointment date:  Bowel Prep reminder: N/A   For Anesthesia: PCP - Dr. Georgiann Mccoy Cardiologist - [facility]. [facility].  Chest x-ray - 02/09/22 EKG - 03/12/22: Chart. Stress Test -  ECHO - 04/24/20: Chart. Cardiac Cath -  Pacemaker/ICD device last checked: Pacemaker orders received: Requested. Device Rep notified:  Spinal Cord Stimulator:N/A  Sleep Study - Yes CPAP - NO  Fasting Blood Sugar - 120's Checks Blood Sugar : continuous CBG monitor Date and result of last Hgb A1c-  Last dose of GLP1 agonist- N/A GLP1 instructions:   Last dose of SGLT-2 inhibitors-  SGLT-2 instructions:   Blood Thinner Instructions: Aspirin Instructions: Last Dose:  Activity level: Can go up a flight of stairs and activities of daily living without stopping and without chest pain and/or shortness of breath   Able to exercise without chest pain and/or shortness of breath   Unable to go up a flight of stairs without chest pain and/or shortness of breath. Pt. Walks with a cane,weakness on both legs.    Anesthesia review: Hx: CHF,CAD,Afib,HTN,MI,ICD.  Patient denies shortness of breath, fever, cough and chest pain at PAT appointment   Patient verbalized understanding of instructions that were given to them at the PAT appointment. Patient was also instructed that they will need to review over the PAT instructions again at home before surgery.

## 2022-04-20 NOTE — Progress Notes (Signed)
Anesthesia Chart Review   Case: [id number] Date/Time: 04/23/22 1015   Procedures:      CYSTOSCOPY WITH BIOPSY     CYSTOSCOPY WITH RETROGRADE PYELOGRAM (Bilateral)   Anesthesia type: General   Pre-op diagnosis: HISTORY OF BLADDER CANCER   Location: [facility] / [facility]   Surgeons: Marcine Matar, MD       DISCUSSION:81 y.o. former smoker with h/o GERD, HTN, CAD, AICD in place, COPD, history of bladder cancer scheduled for above procedure 04/23/2022 with Dr. Marcine Matar.  Patient last seen by pulmonologist 02/20/2022.  Clearance received from PCP which states patient is low risk for planned procedure.  Device orders requested.  VS: BP (!) 124/59   Pulse 61   Temp 36.7 C (Oral)   Ht 6' (1.829 m)   Wt 103.4 kg   SpO2 96%   BMI 30.92 kg/m   PROVIDERS: Pomposini, Rande Brunt, MD is PCP   LABS: Labs reviewed: Acceptable for surgery. (all labs ordered are listed, but only abnormal results are displayed)  Labs Reviewed  HEMOGLOBIN A1C - Abnormal; Notable for the following components:      Result Value   Hgb A1c MFr Bld 7.5 (*)    All other components within normal limits  GLUCOSE, CAPILLARY - Abnormal; Notable for the following components:   Glucose-Capillary 153 (*)    All other components within normal limits  CBC     IMAGES:   EKG:   CV: Echo 03/10/2022 1.  Left ventricle, systolic function was moderately reduced.  The estimated ejection fraction was in the range of 25% to 35%.  Akinesis of the basal inferolateral myocardium.  The end-diastolic diam ammeter is 6.9 cm. 2.  Right ventricle.  The cavity size is normal.  Systolic function is normal. Impression: Date of prior study: 04/24/2020 compared to prior report, no significant changes Past Medical History:  Diagnosis Date   AICD (automatic cardioverter/defibrillator) present    Arthritis    Cancer    Coronary artery disease    Diabetes 06/04/2016   Dyspnea    Essential hypertension, benign  06/04/2016   Fatty liver    GERD (gastroesophageal reflux disease) 06/04/2016   High cholesterol    History of kidney stones    HOH (hard of hearing)    Kidney stones 06/04/2016   Myocardial infarction 1999   Pneumonia    Presence of permanent cardiac pacemaker     Past Surgical History:  Procedure Laterality Date   ANAL FISSURE REPAIR  1968   BIOPSY  06/22/2016   Procedure: BIOPSY;  Surgeon: Malissa Hippo, MD;  [facility];  Service: Endoscopy;;  gastric   CARDIAC DEFIBRILLATOR PLACEMENT     CORONARY ARTERY BYPASS GRAFT     04/2015   ESOPHAGOGASTRODUODENOSCOPY N/A 06/22/2016   Procedure: ESOPHAGOGASTRODUODENOSCOPY (EGD);  Surgeon: Malissa Hippo, MD;  [facility];  Service: Endoscopy;  Laterality: N/A;  7:30   INSERT / REPLACE / REMOVE PACEMAKER     JOINT REPLACEMENT Left 2017   REPLACEMENT TOTAL KNEE     left 2017   TRANSURETHRAL RESECTION OF BLADDER TUMOR N/A 10/26/2017   Procedure: TRANSURETHRAL RESECTION OF BLADDER TUMOR (TURBT);  Surgeon: Marcine Matar, MD;  Location: [facility];  Service: Urology;  Laterality: N/A;    MEDICATIONS:  acetaminophen (TYLENOL) 500 MG tablet   amoxicillin (AMOXIL) 500 MG capsule   carvedilol (COREG) 6.25 MG tablet   famotidine (PEPCID) 20 MG tablet   insulin glargine (LANTUS) 100  UNIT/ML injection   insulin lispro (HUMALOG) 100 UNIT/ML injection   levocetirizine (XYZAL) 5 MG tablet   levothyroxine (SYNTHROID) 75 MCG tablet   Magnesium 400 MG TABS   rosuvastatin (CRESTOR) 20 MG tablet   sacubitril-valsartan (ENTRESTO) 97-103 MG   sitaGLIPtin (JANUVIA) 100 MG tablet   sotalol (BETAPACE) 80 MG tablet   tamsulosin (FLOMAX) 0.4 MG CAPS capsule   Tiotropium Bromide-Olodaterol (STIOLTO RESPIMAT) 2.5-2.5 MCG/ACT AERS   No current facility-administered medications for this encounter.   Jodell Cipro Ward, PA-C [facility] [phone]

## 2022-04-21 ENCOUNTER — Telehealth: Payer: Self-pay

## 2022-04-21 NOTE — Telephone Encounter (Signed)
Returned call. Left voice mail to return call to office

## 2022-04-21 NOTE — Telephone Encounter (Signed)
Patient and patients wife called with questions regarding patient surgery on 4/18. They wanted to know what the surgery entails. They would like a call at [phone]

## 2022-04-22 NOTE — H&P (Signed)
H&P  Chief Complaint: History of bladder cancer with possible recurrence  History of Present Illness: 82 year old male presents at this time for cystoscopy, retrograde pyelograms bilaterally as well as bladder biopsies.  He has a history of high-grade nonmuscle invasive bladder cancer and is completed immune therapy.  Recent cytology revealed the patient to have probable recurrence.  Past Medical History:  Diagnosis Date   AICD (automatic cardioverter/defibrillator) present    Arthritis    Cancer    Coronary artery disease    Diabetes 06/04/2016   Dyspnea    Essential hypertension, benign 06/04/2016   Fatty liver    GERD (gastroesophageal reflux disease) 06/04/2016   High cholesterol    History of kidney stones    HOH (hard of hearing)    Kidney stones 06/04/2016   Myocardial infarction 1999   Pneumonia    Presence of permanent cardiac pacemaker     Past Surgical History:  Procedure Laterality Date   ANAL FISSURE REPAIR  1968   BIOPSY  06/22/2016   Procedure: BIOPSY;  Surgeon: Malissa Hippo, MD;  [facility];  Service: Endoscopy;;  gastric   CARDIAC DEFIBRILLATOR PLACEMENT     CORONARY ARTERY BYPASS GRAFT     04/2015   ESOPHAGOGASTRODUODENOSCOPY N/A 06/22/2016   Procedure: ESOPHAGOGASTRODUODENOSCOPY (EGD);  Surgeon: Malissa Hippo, MD;  [facility];  Service: Endoscopy;  Laterality: N/A;  7:30   INSERT / REPLACE / REMOVE PACEMAKER     JOINT REPLACEMENT Left 2017   REPLACEMENT TOTAL KNEE     left 2017   TRANSURETHRAL RESECTION OF BLADDER TUMOR N/A 10/26/2017   Procedure: TRANSURETHRAL RESECTION OF BLADDER TUMOR (TURBT);  Surgeon: Marcine Matar, MD;  Location: [facility];  Service: Urology;  Laterality: N/A;    Home Medications:  Allergies as of 04/22/2022   No Known Allergies      Medication List      Notice   Cannot display discharge medications because the patient has not yet been admitted.     Allergies: No Known  Allergies  Family History  Problem Relation Age of Onset   Stroke Mother    Colon polyps Neg Hx    Colon cancer Neg Hx     Social History:  reports that he quit smoking about 25 years ago. His smoking use included cigarettes. He has a 75.00 pack-year smoking history. He has never used smokeless tobacco. He reports that he does not drink alcohol and does not use drugs.  ROS: A complete review of systems was performed.  All systems are negative except for pertinent findings as noted.  Physical Exam:  Vital signs in last 24 hours: There were no vitals taken for this visit. Constitutional:  Alert and oriented, No acute distress Cardiovascular: Regular rate  Respiratory: Normal respiratory effort Neurologic: Grossly intact, no focal deficits Psychiatric: Normal mood and affect  I have reviewed prior pt notes  I have reviewed urinalysis results  I have independently reviewed prior imaging  I have reviewed prior PSA results  I have reviewed prior urine cytology   Impression/Assessment:  History of high-grade nonmuscle invasive bladder cancer, prior resection in 2019 with possible recurrence  Plan:  Cystoscopy, bilateral retrograde pyelograms, bladder biopsies

## 2022-04-22 NOTE — Anesthesia Preprocedure Evaluation (Signed)
Anesthesia Evaluation  Patient identified by MRN, date of birth, ID band Patient awake    Reviewed: Allergy & Precautions, H&P , NPO status , Patient's Chart, lab work & pertinent test results  Airway Mallampati: II   Neck ROM: full    Dental   Pulmonary shortness of breath, asthma , former smoker   breath sounds clear to auscultation       Cardiovascular hypertension, + CAD, + Past MI and + CABG  + dysrhythmias Atrial Fibrillation + pacemaker + Cardiac Defibrillator  Rhythm:regular Rate:Normal  TTE (03/25/22): EF 25-35%   Neuro/Psych    GI/Hepatic ,GERD  ,,  Endo/Other  diabetes, Type 2    Renal/GU stones     Musculoskeletal  (+) Arthritis ,    Abdominal   Peds  Hematology   Anesthesia Other Findings   Reproductive/Obstetrics                             Anesthesia Physical Anesthesia Plan  ASA: 3  Anesthesia Plan: General   Post-op Pain Management:    Induction: Intravenous  PONV Risk Score and Plan: 2 and Ondansetron, Dexamethasone, Midazolam and Treatment may vary due to age or medical condition  Airway Management Planned: LMA  Additional Equipment:   Intra-op Plan:   Post-operative Plan: Extubation in OR  Informed Consent: I have reviewed the patients History and Physical, chart, labs and discussed the procedure including the risks, benefits and alternatives for the proposed anesthesia with the patient or authorized representative who has indicated his/her understanding and acceptance.     Dental advisory given  Plan Discussed with: CRNA, Anesthesiologist and Surgeon  Anesthesia Plan Comments: (See PAT note 04/15/2022)       Anesthesia Quick Evaluation

## 2022-04-23 ENCOUNTER — Encounter (HOSPITAL_COMMUNITY): Payer: Self-pay | Admitting: Urology

## 2022-04-23 ENCOUNTER — Encounter (HOSPITAL_COMMUNITY)
Admission: RE | Disposition: A | Discharge status: Home / Self Care | Payer: Self-pay | Source: Ambulatory Visit | Attending: Urology

## 2022-04-23 ENCOUNTER — Ambulatory Visit (HOSPITAL_COMMUNITY)
Admission: RE | Admit: 2022-04-23 | Discharge: 2022-04-23 | Disposition: A | Discharge status: Home / Self Care | Payer: Medicare Other | Source: Ambulatory Visit | Attending: Urology | Admitting: Urology

## 2022-04-23 ENCOUNTER — Ambulatory Visit (HOSPITAL_BASED_OUTPATIENT_CLINIC_OR_DEPARTMENT_OTHER): Payer: Medicare Other | Admitting: Anesthesiology

## 2022-04-23 ENCOUNTER — Ambulatory Visit (HOSPITAL_COMMUNITY): Payer: Medicare Other | Admitting: Physician Assistant

## 2022-04-23 ENCOUNTER — Ambulatory Visit (HOSPITAL_COMMUNITY): Payer: Medicare Other

## 2022-04-23 DIAGNOSIS — Z951 Presence of aortocoronary bypass graft: Secondary | ICD-10-CM | POA: Insufficient documentation

## 2022-04-23 DIAGNOSIS — I10 Essential (primary) hypertension: Secondary | ICD-10-CM

## 2022-04-23 DIAGNOSIS — Z95810 Presence of automatic (implantable) cardiac defibrillator: Secondary | ICD-10-CM | POA: Insufficient documentation

## 2022-04-23 DIAGNOSIS — Z8551 Personal history of malignant neoplasm of bladder: Secondary | ICD-10-CM | POA: Diagnosis not present

## 2022-04-23 DIAGNOSIS — I2510 Atherosclerotic heart disease of native coronary artery without angina pectoris: Secondary | ICD-10-CM | POA: Insufficient documentation

## 2022-04-23 DIAGNOSIS — R896 Abnormal cytological findings in specimens from other organs, systems and tissues: Secondary | ICD-10-CM

## 2022-04-23 DIAGNOSIS — N3080 Other cystitis without hematuria: Secondary | ICD-10-CM | POA: Diagnosis not present

## 2022-04-23 DIAGNOSIS — Z87891 Personal history of nicotine dependence: Secondary | ICD-10-CM | POA: Diagnosis not present

## 2022-04-23 DIAGNOSIS — I4891 Unspecified atrial fibrillation: Secondary | ICD-10-CM | POA: Diagnosis not present

## 2022-04-23 DIAGNOSIS — E119 Type 2 diabetes mellitus without complications: Secondary | ICD-10-CM | POA: Diagnosis not present

## 2022-04-23 DIAGNOSIS — I252 Old myocardial infarction: Secondary | ICD-10-CM | POA: Diagnosis not present

## 2022-04-23 DIAGNOSIS — Z08 Encounter for follow-up examination after completed treatment for malignant neoplasm: Secondary | ICD-10-CM | POA: Insufficient documentation

## 2022-04-23 DIAGNOSIS — Z09 Encounter for follow-up examination after completed treatment for conditions other than malignant neoplasm: Secondary | ICD-10-CM | POA: Insufficient documentation

## 2022-04-23 HISTORY — PX: CYSTOSCOPY W/ RETROGRADES: SHX1426

## 2022-04-23 HISTORY — PX: CYSTOSCOPY WITH BIOPSY: SHX5122

## 2022-04-23 LAB — BASIC METABOLIC PANEL
Anion gap: 10 (ref 5–15)
BUN: 29 mg/dL — ABNORMAL HIGH (ref 8–23)
CO2: 24 mmol/L (ref 22–32)
Calcium: 9 mg/dL (ref 8.9–10.3)
Chloride: 102 mmol/L (ref 98–111)
Creatinine, Ser: 1.17 mg/dL (ref 0.61–1.24)
GFR, Estimated: >60 mL/min (ref >60)
Glucose, Bld: 201 mg/dL — ABNORMAL HIGH (ref 70–99)
Potassium: 5.2 mmol/L — ABNORMAL HIGH (ref 3.5–5.1)
Sodium: 136 mmol/L (ref 135–145)

## 2022-04-23 LAB — GLUCOSE, CAPILLARY
Glucose-Capillary: 224 mg/dL — ABNORMAL HIGH (ref 70–99)
Glucose-Capillary: 131 mg/dL — ABNORMAL HIGH (ref 70–99)

## 2022-04-23 SURGERY — CYSTOSCOPY, WITH BIOPSY
Anesthesia: General

## 2022-04-23 MED ORDER — DEXAMETHASONE SODIUM PHOSPHATE 10 MG/ML IJ SOLN
INTRAMUSCULAR | Status: AC
  Filled 2022-04-23: qty 1

## 2022-04-23 MED ORDER — PHENYLEPHRINE HCL-NACL 20-0.9 MG/250ML-% IV SOLN
INTRAVENOUS | Status: DC | PRN
  Administered 2022-04-23: 25 ug/min via INTRAVENOUS

## 2022-04-23 MED ORDER — ORAL CARE MOUTH RINSE: 15.0000 mL | Freq: Once | OROMUCOSAL | Status: AC

## 2022-04-23 MED ORDER — PHENYLEPHRINE 80 MCG/ML (10ML) SYRINGE FOR IV PUSH (FOR BLOOD PRESSURE SUPPORT)
PREFILLED_SYRINGE | INTRAVENOUS | Status: DC | PRN
  Administered 2022-04-23 (×2): 160 ug via INTRAVENOUS

## 2022-04-23 MED ORDER — OXYCODONE HCL 5 MG PO TABS: 5.0000 mg | ORAL_TABLET | Freq: Once | ORAL | Status: DC | PRN

## 2022-04-23 MED ORDER — CHLORHEXIDINE GLUCONATE 0.12 % MT SOLN
15.0000 mL | Freq: Once | OROMUCOSAL | Status: AC
  Administered 2022-04-23: 15 mL via OROMUCOSAL

## 2022-04-23 MED ORDER — ONDANSETRON HCL 4 MG/2ML IJ SOLN
INTRAMUSCULAR | Status: AC
  Filled 2022-04-23: qty 2

## 2022-04-23 MED ORDER — LIDOCAINE 2% (20 MG/ML) 5 ML SYRINGE
INTRAMUSCULAR | Status: DC | PRN
  Administered 2022-04-23: 60 mg via INTRAVENOUS

## 2022-04-23 MED ORDER — PROPOFOL 10 MG/ML IV BOLUS
INTRAVENOUS | Status: DC | PRN
  Administered 2022-04-23: 150 mg via INTRAVENOUS

## 2022-04-23 MED ORDER — EPHEDRINE SULFATE-NACL 50-0.9 MG/10ML-% IV SOSY
PREFILLED_SYRINGE | INTRAVENOUS | Status: DC | PRN
  Administered 2022-04-23 (×2): 5 mg via INTRAVENOUS

## 2022-04-23 MED ORDER — FENTANYL CITRATE (PF) 100 MCG/2ML IJ SOLN
INTRAMUSCULAR | Status: AC
  Filled 2022-04-23: qty 2

## 2022-04-23 MED ORDER — PHENYLEPHRINE 80 MCG/ML (10ML) SYRINGE FOR IV PUSH (FOR BLOOD PRESSURE SUPPORT)
PREFILLED_SYRINGE | INTRAVENOUS | Status: AC
  Filled 2022-04-23: qty 10

## 2022-04-23 MED ORDER — OXYCODONE HCL 5 MG/5ML PO SOLN: 5.0000 mg | Freq: Once | ORAL | Status: DC | PRN

## 2022-04-23 MED ORDER — FENTANYL CITRATE PF 50 MCG/ML IJ SOSY: 25.0000 ug | PREFILLED_SYRINGE | INTRAMUSCULAR | Status: DC | PRN

## 2022-04-23 MED ORDER — LACTATED RINGERS IV SOLN: INTRAVENOUS | Status: DC

## 2022-04-23 MED ORDER — DEXAMETHASONE SODIUM PHOSPHATE 10 MG/ML IJ SOLN
INTRAMUSCULAR | Status: DC | PRN
  Administered 2022-04-23: 5 mg via INTRAVENOUS

## 2022-04-23 MED ORDER — SODIUM CHLORIDE 0.9 % IR SOLN
Status: DC | PRN
  Administered 2022-04-23: 3000 mL

## 2022-04-23 MED ORDER — ONDANSETRON HCL 4 MG/2ML IJ SOLN: 4.0000 mg | Freq: Four times a day (QID) | INTRAMUSCULAR | Status: DC | PRN

## 2022-04-23 MED ORDER — IOHEXOL 300 MG/ML  SOLN
INTRAMUSCULAR | Status: DC | PRN
  Administered 2022-04-23: 9 mL

## 2022-04-23 MED ORDER — CEPHALEXIN 500 MG PO CAPS: 500.0000 mg | ORAL_CAPSULE | Freq: Two times a day (BID) | ORAL | 0 refills | Status: AC

## 2022-04-23 MED ORDER — PROPOFOL 10 MG/ML IV BOLUS
INTRAVENOUS | Status: AC
  Filled 2022-04-23: qty 20

## 2022-04-23 MED ORDER — CEFAZOLIN SODIUM-DEXTROSE 2-4 GM/100ML-% IV SOLN
2.0000 g | INTRAVENOUS | Status: AC
  Administered 2022-04-23: 2 g via INTRAVENOUS
  Filled 2022-04-23: qty 100

## 2022-04-23 MED ORDER — ONDANSETRON HCL 4 MG/2ML IJ SOLN
INTRAMUSCULAR | Status: DC | PRN
  Administered 2022-04-23: 4 mg via INTRAVENOUS

## 2022-04-23 MED ORDER — FENTANYL CITRATE (PF) 100 MCG/2ML IJ SOLN
INTRAMUSCULAR | Status: DC | PRN
  Administered 2022-04-23: 50 ug via INTRAVENOUS

## 2022-04-23 SURGICAL SUPPLY — 27 items
BAG COUNTER SPONGE SURGICOUNT (BAG) IMPLANT
BAG DRN RND TRDRP ANRFLXCHMBR (UROLOGICAL SUPPLIES)
BAG SPNG CNTER NS LX DISP (BAG)
BAG URINE DRAIN 2000ML AR STRL (UROLOGICAL SUPPLIES) IMPLANT
BAG URO CATCHER STRL LF (MISCELLANEOUS) ×2 IMPLANT
CATH FOLEY 2WAY SLVR  5CC 18FR (CATHETERS) ×2
CATH FOLEY 2WAY SLVR 30CC 24FR (CATHETERS) IMPLANT
CATH FOLEY 2WAY SLVR 5CC 18FR (CATHETERS) IMPLANT
CATH URETL OPEN END 6FR 70 (CATHETERS) ×2 IMPLANT
CLOTH BEACON ORANGE TIMEOUT ST (SAFETY) ×2 IMPLANT
DRAPE FOOT SWITCH (DRAPES) ×2 IMPLANT
EVACUATOR MICROVAS BLADDER (UROLOGICAL SUPPLIES) IMPLANT
GLOVE SURG LX STRL 8.0 MICRO (GLOVE) ×2 IMPLANT
GOWN STRL REUS W/ TWL XL LVL3 (GOWN DISPOSABLE) ×4 IMPLANT
GOWN STRL REUS W/TWL XL LVL3 (GOWN DISPOSABLE) ×4
GUIDEWIRE STR DUAL SENSOR (WIRE) ×2 IMPLANT
KIT TURNOVER KIT A (KITS) IMPLANT
LOOP CUT BIPOLAR 24F LRG (ELECTROSURGICAL) ×2 IMPLANT
MANIFOLD NEPTUNE II (INSTRUMENTS) ×2 IMPLANT
NDL SAFETY ECLIP 18X1.5 (MISCELLANEOUS) ×2 IMPLANT
PACK CYSTO (CUSTOM PROCEDURE TRAY) ×2 IMPLANT
PENCIL SMOKE EVACUATOR (MISCELLANEOUS) IMPLANT
SYR TOOMEY IRRIG 70ML (MISCELLANEOUS)
SYRINGE TOOMEY IRRIG 70ML (MISCELLANEOUS) IMPLANT
TUBING CONNECTING 10 (TUBING) ×2 IMPLANT
TUBING UROLOGY SET (TUBING) ×2 IMPLANT
WATER STERILE IRR 3000ML UROMA (IV SOLUTION) ×2 IMPLANT

## 2022-04-23 NOTE — Interval H&P Note (Signed)
History and Physical Interval Note:  04/23/2022 10:38 AM  Michael Key  has presented today for surgery, with the diagnosis of HISTORY OF BLADDER CANCER.  The various methods of treatment have been discussed with the patient and family. After consideration of risks, benefits and other options for treatment, the patient has consented to  Procedure(s): CYSTOSCOPY WITH BIOPSY (N/A) CYSTOSCOPY WITH RETROGRADE PYELOGRAM (Bilateral) as a surgical intervention.  The patient's history has been reviewed, patient examined, no change in status, stable for surgery.  I have reviewed the patient's chart and labs.  Questions were answered to the patient's satisfaction.     Bertram Millard Sabria Florido

## 2022-04-23 NOTE — Anesthesia Postprocedure Evaluation (Signed)
Anesthesia Post Note  Patient: Michael Key  Procedure(s) Performed: CYSTOSCOPY WITH BLADDER BIOPSY CYSTOSCOPY WITH RETROGRADE PYELOGRAM (Bilateral)     Patient location during evaluation: PACU Anesthesia Type: General Level of consciousness: awake and alert Pain management: pain level controlled Vital Signs Assessment: post-procedure vital signs reviewed and stable Respiratory status: spontaneous breathing, nonlabored ventilation, respiratory function stable and patient connected to nasal cannula oxygen Cardiovascular status: blood pressure returned to baseline and stable Postop Assessment: no apparent nausea or vomiting Anesthetic complications: no   No notable events documented.  Last Vitals:  Vitals:   04/23/22 1315 04/23/22 1339  BP: 133/60 130/68  Pulse: 63 60  Resp: 13 14  Temp:  36.8 C  SpO2: 95% 95%    Last Pain:  Vitals:   04/23/22 1339  TempSrc: Oral  PainSc: 0-No pain                 Aida Lemaire S

## 2022-04-23 NOTE — Op Note (Signed)
Preoperative diagnosis: History of high-grade nonmuscle invasive bladder cancer with recent positive cytologies  Postoperative diagnosis: Same, with normal-appearing cystoscopy and retrograde pyelograms  Principal procedure: Cystoscopy, bilateral retrograde pyelograms, bladder biopsies, bladder washings, fluoroscopic interpretation  Surgeon: Ananias Kolander  Anesthesia: General with LMA  Specimen: 1.  Bladder washings for cytology 2.  Random bladder biopsies for permanent pathology  Estimated blood loss: Less than 10 mL  Drains: None  Indications: 82 year old male with history of nonmuscle invasive bladder cancer, high-grade.  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  Thus his presentation now for cystoscopy, bladder biopsies retrograde pyelograms.  I discussed the procedure with the patient, his wife.  They understand the procedure, expected outcome, risks and complications.  They desire to proceed.  Findings: Urethra was normal, without stricture or lesion.  Prostatic urethra was normal.  There were no lesions.  Moderate obstruction from bilobar hypertrophy.  Urothelium of the bladder was normal.  Prior resection sites were noted.  I did not see any significant erythematous patches.  1 small area of telangiectatic vessels posteriorly in the midline.  No raised urothelial lesions anywhere.  Ureteral orifices were normal.  Retrograde studies of both ureters revealed normal ureteral appearance bilaterally, without hydronephrosis, filling defects or strictures.  Pyelocalyceal systems were normal bilaterally.  Save for a small bubble that moved to the right UPJ area, no filling defects were noted.  Description of procedure: Patiently properly identified in the holding area.  Taken to the operating room where general anesthetic was administered with the LMA.  He was placed in the dorsolithotomy position.  Genitalia and perineum were  prepped, draped, proper timeout performed.  21 [place] panendoscope with Foroblique lens passed under direct vision.  The bladder circumferentially inspected first with that lens and then with a 70 degree lens.  Except for that 1 small area posteriorly in the upper bladder area with slight telangiectatic vessels, no specific lesions were noted.  Prior resection scars appeared normal.  I took bladder washings with a Toomey syringe and sent these labeled "bladder washings" for cytology.  At this point bilateral retrograde ureteropyelogram's were performed.  Omnipaque was utilized for these.  They appeared totally normal.  Following this, with images saved, I performed random biopsies.  Cold cup forceps utilized.  2-3 biopsies from the posterior area were taken as well as biopsies from the prior resection sites as well as the bladder neck posteriorly.  These were sent labeled "random bladder biopsies".  Biopsy sites were cauterized using the Bugbee electrode.  I felt it judicious to leave a Foley catheter in.  After the hemostasis was achieved the scope was removed and an 18 [place] Foley catheter placed.  Balloon filled with 10 cc of water and this was hooked to dependent drainage.  At this point the procedure was terminated.  The patient was awakened, taken to the PACU in stable condition having tolerated the procedure well.

## 2022-04-23 NOTE — Discharge Instructions (Addendum)

## 2022-04-23 NOTE — Anesthesia Procedure Notes (Signed)
Procedure Name: LMA Insertion Date/Time: 04/23/2022 11:31 AM  Performed by: Elisabeth Cara, CRNAPre-anesthesia Checklist: Emergency Drugs available, Suction available, Patient being monitored, Timeout performed and Patient identified Oxygen Delivery Method: Circle system utilized Preoxygenation: Pre-oxygenation with 100% oxygen Induction Type: IV induction LMA: LMA with gastric port inserted LMA Size: 4.0 Number of attempts: 1 Placement Confirmation: positive ETCO2 and breath sounds checked- equal and bilateral Tube secured with: Tape Dental Injury: Teeth and Oropharynx as per pre-operative assessment

## 2022-04-23 NOTE — Interval H&P Note (Signed)
History and Physical Interval Note:  04/23/2022 11:05 AM  Michael Key  has presented today for surgery, with the diagnosis of HISTORY OF BLADDER CANCER.  The various methods of treatment have been discussed with the patient and family. After consideration of risks, benefits and other options for treatment, the patient has consented to  Procedure(s): CYSTOSCOPY WITH BIOPSY (N/A) CYSTOSCOPY WITH RETROGRADE PYELOGRAM (Bilateral) as a surgical intervention.  The patient's history has been reviewed, patient examined, no change in status, stable for surgery.  I have reviewed the patient's chart and labs.  Questions were answered to the patient's satisfaction.     Bertram Millard Alayne Estrella

## 2022-04-23 NOTE — Transfer of Care (Signed)
Immediate Anesthesia Transfer of Care Note  Patient: Michael Key  Procedure(s) Performed: CYSTOSCOPY WITH BLADDER BIOPSY CYSTOSCOPY WITH RETROGRADE PYELOGRAM (Bilateral)  Patient Location: PACU  Anesthesia Type:General  Level of Consciousness: awake, alert , oriented, and patient cooperative  Airway & Oxygen Therapy: Patient Spontanous Breathing and Patient connected to face mask oxygen  Post-op Assessment: Report given to RN, Post -op Vital signs reviewed and stable, and Patient moving all extremities  Post vital signs: Reviewed and stable  Last Vitals:  Vitals Value Taken Time  BP 130/57 04/23/22 1212  Temp    Pulse 66 04/23/22 1214  Resp 24 04/23/22 1214  SpO2 100 % 04/23/22 1214  Vitals shown include unvalidated device data.  Last Pain:  Vitals:   04/23/22 0824  TempSrc:   PainSc: 0-No pain      Patients Stated Pain Goal: 4 (04/23/22 0824)  Complications: No notable events documented.

## 2022-04-24 ENCOUNTER — Encounter (HOSPITAL_COMMUNITY): Payer: Self-pay | Admitting: Urology

## 2022-04-27 LAB — CYTOLOGY - NON PAP

## 2022-04-28 LAB — SURGICAL PATHOLOGY

## 2022-05-07 ENCOUNTER — Other Ambulatory Visit: Payer: Self-pay | Admitting: Urology

## 2022-05-12 ENCOUNTER — Encounter: Payer: Self-pay | Admitting: Urology

## 2022-05-12 ENCOUNTER — Ambulatory Visit: Payer: Medicare Other | Admitting: Urology

## 2022-05-12 ENCOUNTER — Ambulatory Visit (INDEPENDENT_AMBULATORY_CARE_PROVIDER_SITE_OTHER): Payer: Medicare Other | Admitting: Urology

## 2022-05-12 VITALS — BP 110/64 | HR 73 | Ht 72.0 in | Wt 228.0 lb

## 2022-05-12 DIAGNOSIS — Z8551 Personal history of malignant neoplasm of bladder: Secondary | ICD-10-CM

## 2022-05-12 NOTE — Progress Notes (Signed)
History of Present Illness: "Michael Key" returns for continued  follow-up of bladder cancer.  10.22.2019: TURBT/bladder biopsy. Pathology revealed carcinoma in situ.  2.25.2020: He completed 6 induction instillations of BCG on 1.3.2020. Cystoscopy c/w BCG reaction, cytology revealed atypia/inflamation.  3.24.2020: Completed 1st maintenance BCG (3 Rxs).  6.30.2020: Patient returns for follow up cystoscopy following two maintenances of BCG (third delayed due to COVID). Cysto nml.  8.4.2020: Completed 2nd BCG maintenance rx.  9.15.2020: Cystoscopy negative. 12.15.2020: Completed maintenance BCG #3 2.23.2021: Cystoscopy/cytology negative. 8.24.2021: Cysto/cytology negative  3.1.2022: Cysto negative. Cytology--atypical cells 3.7.2023: Cysto negative.  3.12.2024: Systolic negative, cytology positive.  4.18.2024: Underwent anesthetic cystoscopy, bladder barbotage, bilateral retrograde ureteropyelogram's, bladder biopsies.  Retrograde study is normal.  Bladder washings-cytology negative.  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  5.7.2024: Here for postop check.  He had no real issues postoperatively.  Past Medical History:  Diagnosis Date   AICD (automatic cardioverter/defibrillator) present    Arthritis    Cancer (HCC)    Coronary artery disease    Diabetes (HCC) 06/04/2016   Dyspnea    Essential hypertension, benign 06/04/2016   Fatty liver    GERD (gastroesophageal reflux disease) 06/04/2016   High cholesterol    History of kidney stones    HOH (hard of hearing)    Kidney stones 06/04/2016   Myocardial infarction (HCC) 1999   Pneumonia    Presence of permanent cardiac pacemaker     Past Surgical History:  Procedure Laterality Date   ANAL FISSURE REPAIR  1968   BIOPSY  06/22/2016   Procedure: BIOPSY;  Surgeon: Malissa Hippo, MD;  [facility];  Service: Endoscopy;;  gastric   CARDIAC DEFIBRILLATOR PLACEMENT     CORONARY ARTERY BYPASS  GRAFT     04/2015   CYSTOSCOPY W/ RETROGRADES Bilateral 04/23/2022   Procedure: CYSTOSCOPY WITH RETROGRADE PYELOGRAM;  Surgeon: Marcine Matar, MD;  Location: [facility];  Service: Urology;  Laterality: Bilateral;   CYSTOSCOPY WITH BIOPSY N/A 04/23/2022   Procedure: CYSTOSCOPY WITH BLADDER BIOPSY;  Surgeon: Marcine Matar, MD;  Location: [facility];  Service: Urology;  Laterality: N/A;   ESOPHAGOGASTRODUODENOSCOPY N/A 06/22/2016   Procedure: ESOPHAGOGASTRODUODENOSCOPY (EGD);  Surgeon: Malissa Hippo, MD;  [facility];  Service: Endoscopy;  Laterality: N/A;  7:30   INSERT / REPLACE / REMOVE PACEMAKER     JOINT REPLACEMENT Left 2017   REPLACEMENT TOTAL KNEE     left 2017   TRANSURETHRAL RESECTION OF BLADDER TUMOR N/A 10/26/2017   Procedure: TRANSURETHRAL RESECTION OF BLADDER TUMOR (TURBT);  Surgeon: Marcine Matar, MD;  Location: [facility];  Service: Urology;  Laterality: N/A;    Home Medications:  Allergies as of 05/12/2022   No Known Allergies      Medication List        Accurate as of May 12, 2022  3:30 PM. If you have any questions, ask your nurse or doctor.          acetaminophen 500 MG tablet Commonly known as: TYLENOL Take 500-1,000 mg by mouth every 6 (six) hours as needed for moderate pain or headache.   carvedilol 6.25 MG tablet Commonly known as: COREG Take 6.25 mg by mouth 2 (two) times daily with a meal.   famotidine 20 MG tablet Commonly known as: PEPCID Take 1 tablet (20 mg total) by mouth at bedtime.   insulin glargine 100 UNIT/ML injection Commonly known as: LANTUS Inject 60 Units into the skin at bedtime.   insulin  lispro 100 UNIT/ML injection Commonly known as: HUMALOG Inject 26 Units into the skin 3 (three) times daily with meals.   levocetirizine 5 MG tablet Commonly known as: XYZAL Take 5 mg by mouth daily.   levothyroxine 75 MCG tablet Commonly known as: SYNTHROID Take 75 mcg by mouth daily.   Magnesium 400 MG Tabs Take 400 mg  by mouth daily.   rosuvastatin 20 MG tablet Commonly known as: CRESTOR Take 20 mg by mouth daily.   sacubitril-valsartan 97-103 MG Commonly known as: ENTRESTO Take 1 tablet by mouth 2 (two) times daily.   sitaGLIPtin 100 MG tablet Commonly known as: JANUVIA Take 100 mg by mouth daily.   sotalol 80 MG tablet Commonly known as: BETAPACE Take 80 mg by mouth 2 (two) times daily.   Stiolto Respimat 2.5-2.5 MCG/ACT Aers Generic drug: Tiotropium Bromide-Olodaterol Inhale 2 puffs into the lungs daily.   tamsulosin 0.4 MG Caps capsule Commonly known as: FLOMAX Take 1 capsule by mouth once daily        Allergies: No Known Allergies  Family History  Problem Relation Age of Onset   Stroke Mother    Colon polyps Neg Hx    Colon cancer Neg Hx     Social History:  reports that he quit smoking about 25 years ago. His smoking use included cigarettes. He has a 75.00 pack-year smoking history. He has never used smokeless tobacco. He reports that he does not drink alcohol and does not use drugs.  ROS: A complete review of systems was performed.  All systems are negative except for pertinent findings as noted.  Physical Exam:  Vital signs in last 24 hours: BP 110/64   Pulse 73   Ht 6' (1.829 m)   Wt 228 lb (103.4 kg)   BMI 30.92 kg/m  Constitutional:  Alert and oriented, No acute distress Cardiovascular: Regular rate  Respiratory: Normal respiratory effort Neurologic: Grossly intact, no focal deficits Psychiatric: Normal mood and affect  I have reviewed prior pt notes  I have reviewed cytology as well as pathology.  I have independently reviewed prior imaging-retrograde studies     Impression/Assessment:  History of high-grade nonmuscle invasive bladder cancer.  Recent positive cytologies led to anesthetic cystoscopy, bladder biopsies and retrogrades.  All pathology/cytology/cystoscopy negative  Plan:  No need for further BCG  I will see back in 6 months with  cystoscopy

## 2022-06-05 ENCOUNTER — Ambulatory Visit: Payer: Medicare Other | Admitting: Internal Medicine

## 2022-11-09 NOTE — Progress Notes (Incomplete)
Chief Complaint: No chief complaint on file.   History of Present Illness:  "Michael Key" returns for continued  follow-up of bladder cancer.  10.22.2019: TURBT/bladder biopsy. Pathology revealed carcinoma in situ.  2.25.2020: He completed 6 induction instillations of BCG on 1.3.2020. Cystoscopy c/w BCG reaction, cytology revealed atypia/inflamation.  3.24.2020: Completed 1st maintenance BCG (3 Rxs).  6.30.2020: Patient returns for follow up cystoscopy following two maintenances of BCG (third delayed due to COVID). Cysto nml.  8.4.2020: Completed 2nd BCG maintenance rx.  9.15.2020: Cystoscopy negative. 12.15.2020: Completed maintenance BCG #3 2.23.2021: Cystoscopy/cytology negative. 8.24.2021: Cysto/cytology negative  3.1.2022: Cysto negative. Cytology--atypical cells 3.7.2023: Cysto negative.  3.12.2024: Systolic negative, cytology positive.  4.18.2024: Underwent anesthetic cystoscopy, bladder barbotage, bilateral retrograde ureteropyelogram's, bladder biopsies.  Retrograde study is normal.  Bladder washings-cytology negative.  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  11.5.2024:  Past Medical History:  Past Medical History:  Diagnosis Date   AICD (automatic cardioverter/defibrillator) present    Arthritis    Cancer (HCC)    Coronary artery disease    Diabetes (HCC) 06/04/2016   Dyspnea    Essential hypertension, benign 06/04/2016   Fatty liver    GERD (gastroesophageal reflux disease) 06/04/2016   High cholesterol    History of kidney stones    HOH (hard of hearing)    Kidney stones 06/04/2016   Myocardial infarction (HCC) 1999   Pneumonia    Presence of permanent cardiac pacemaker     Past Surgical History:  Past Surgical History:  Procedure Laterality Date   ANAL FISSURE REPAIR  1968   BIOPSY  06/22/2016   Procedure: BIOPSY;  Surgeon: Malissa Hippo, MD;  Location: [facility];  Service: Endoscopy;;  gastric   CARDIAC DEFIBRILLATOR  PLACEMENT     CORONARY ARTERY BYPASS GRAFT     04/2015   CYSTOSCOPY W/ RETROGRADES Bilateral 04/23/2022   Procedure: CYSTOSCOPY WITH RETROGRADE PYELOGRAM;  Surgeon: Marcine Matar, MD;  Location: [facility];  Service: Urology;  Laterality: Bilateral;   CYSTOSCOPY WITH BIOPSY N/A 04/23/2022   Procedure: CYSTOSCOPY WITH BLADDER BIOPSY;  Surgeon: Marcine Matar, MD;  Location: [facility];  Service: Urology;  Laterality: N/A;   ESOPHAGOGASTRODUODENOSCOPY N/A 06/22/2016   Procedure: ESOPHAGOGASTRODUODENOSCOPY (EGD);  Surgeon: Malissa Hippo, MD;  Location: [facility];  Service: Endoscopy;  Laterality: N/A;  7:30   INSERT / REPLACE / REMOVE PACEMAKER     JOINT REPLACEMENT Left 2017   REPLACEMENT TOTAL KNEE     left 2017   TRANSURETHRAL RESECTION OF BLADDER TUMOR N/A 10/26/2017   Procedure: TRANSURETHRAL RESECTION OF BLADDER TUMOR (TURBT);  Surgeon: Marcine Matar, MD;  Location: [facility];  Service: Urology;  Laterality: N/A;    Allergies:  No Known Allergies  Family History:  Family History  Problem Relation Age of Onset   Stroke Mother    Colon polyps Neg Hx    Colon cancer Neg Hx     Social History:  Social History   Tobacco Use   Smoking status: Former    Current packs/day: 0.00    Average packs/day: 2.5 packs/day for 30.0 years (75.0 ttl pk-yrs)    Types: Cigarettes    Start date: 22    Quit date: 1999    Years since quitting: 25.8   Smokeless tobacco: Never  Vaping Use   Vaping status: Never Used  Substance Use Topics   Alcohol use: No   Drug use: No    Review of symptoms:  Constitutional:  Negative for  unexplained weight loss, night sweats, fever, chills ENT:  Negative for nose bleeds, sinus pain, painful swallowing CV:  Negative for chest pain, shortness of breath, exercise intolerance, palpitations, loss of consciousness Resp:  Negative for cough, wheezing, shortness of breath GI:  Negative for nausea, vomiting, diarrhea, bloody stools GU:  Positives noted  in HPI; otherwise negative for gross hematuria, dysuria, urinary incontinence Neuro:  Negative for seizures, poor balance, limb weakness, slurred speech Psych:  Negative for lack of energy, depression, anxiety Endocrine:  Negative for polydipsia, polyuria, symptoms of hypoglycemia (dizziness, hunger, sweating) Hematologic:  Negative for anemia, purpura, petechia, prolonged or excessive bleeding, use of anticoagulants  Allergic:  Negative for difficulty breathing or choking as a result of exposure to anything; no shellfish allergy; no allergic response (rash/itch) to materials, foods  Physical exam: There were no vitals taken for this visit. GENERAL APPEARANCE:  Well appearing, well developed, well nourished, NAD HEENT: Atraumatic, Normocephalic, oropharynx clear. NECK: Supple without lymphadenopathy or thyromegaly. LUNGS: Clear to auscultation bilaterally. HEART: Regular Rate and Rhythm without murmurs, gallops, or rubs. ABDOMEN: Soft, non-tender, No Masses. GU: Phallus normal, no lesions. Scrotal skin normal. Testicles/epididymal structures normal. Meatus normal. Normal anal sphincter tone, prostate ***mL, symmetric, non nodular, non tender. EXTREMITIES: Moves all extremities well.  Without clubbing, cyanosis, or edema. NEUROLOGIC:  Alert and oriented x 3, normal gait, CN II-XII grossly intact.  MENTAL STATUS:  Appropriate. BACK:  Non-tender to palpation.  No CVAT SKIN:  Warm, dry and intact.    Results: No results found for this or any previous visit (from the past 24 hour(s)).  I have reviewed referring/prior physicians notes  I have reviewed urinalysis  I have reviewed PSA results  I have reviewed prior imaging  Cystoscopy Procedure Note:  Indication: ***  After informed consent and discussion of the procedure and its risks, Michael Key was positioned and prepped in the standard fashion.  Cystoscopy was performed with a flexible cystoscope.    Findings: Urethra:*** Prostate:*** Bladder neck:*** Ureteral orifices:*** Bladder:***  The patient tolerated the procedure well.     Assessment: ***   Plan: ***

## 2022-11-10 ENCOUNTER — Other Ambulatory Visit: Payer: Medicare Other | Admitting: Urology

## 2022-11-10 DIAGNOSIS — N3281 Overactive bladder: Secondary | ICD-10-CM

## 2022-11-10 DIAGNOSIS — Z8551 Personal history of malignant neoplasm of bladder: Secondary | ICD-10-CM

## 2023-03-21 NOTE — Progress Notes (Incomplete)
 History of Present Illness: "Michael Key" returns for continued  follow-up of bladder cancer.  10.22.2019: TURBT/bladder biopsy. Pathology revealed carcinoma in situ.  2.25.2020: He completed 6 induction instillations of BCG on 1.3.2020. Cystoscopy c/w BCG reaction, cytology revealed atypia/inflamation.  3.24.2020: Completed 1st maintenance BCG (3 Rxs).  6.30.2020: Patient returns for follow up cystoscopy following two maintenances of BCG (third delayed due to COVID). Cysto nml.  8.4.2020: Completed 2nd BCG maintenance rx.  9.15.2020: Cystoscopy negative. 12.15.2020: Completed maintenance BCG #3 2.23.2021: Cystoscopy/cytology negative. 8.24.2021: Cysto/cytology negative  3.1.2022: Cysto negative. Cytology--atypical cells 3.7.2023: Cysto negative.  3.12.2024: Systolic negative, cytology positive.  4.18.2024: Underwent anesthetic cystoscopy, bladder barbotage, bilateral retrograde ureteropyelogram's, bladder biopsies.  Retrograde study is normal.  Bladder washings-cytology negative.  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  5.7.2024: Here for postop check.  He had no real issues postoperatively.  Past Medical History:  Diagnosis Date   AICD (automatic cardioverter/defibrillator) present    Arthritis    Cancer (HCC)    Coronary artery disease    Diabetes (HCC) 06/04/2016   Dyspnea    Essential hypertension, benign 06/04/2016   Fatty liver    GERD (gastroesophageal reflux disease) 06/04/2016   High cholesterol    History of kidney stones    HOH (hard of hearing)    Kidney stones 06/04/2016   Myocardial infarction (HCC) 1999   Pneumonia    Presence of permanent cardiac pacemaker     Past Surgical History:  Procedure Laterality Date   ANAL FISSURE REPAIR  1968   BIOPSY  06/22/2016   Procedure: BIOPSY;  Surgeon: Malissa Hippo, MD;  Location: [facility];  Service: Endoscopy;;  gastric   CARDIAC DEFIBRILLATOR PLACEMENT     CORONARY ARTERY BYPASS  GRAFT     04/2015   CYSTOSCOPY W/ RETROGRADES Bilateral 04/23/2022   Procedure: CYSTOSCOPY WITH RETROGRADE PYELOGRAM;  Surgeon: Marcine Matar, MD;  Location: [facility];  Service: Urology;  Laterality: Bilateral;   CYSTOSCOPY WITH BIOPSY N/A 04/23/2022   Procedure: CYSTOSCOPY WITH BLADDER BIOPSY;  Surgeon: Marcine Matar, MD;  Location: [facility];  Service: Urology;  Laterality: N/A;   ESOPHAGOGASTRODUODENOSCOPY N/A 06/22/2016   Procedure: ESOPHAGOGASTRODUODENOSCOPY (EGD);  Surgeon: Malissa Hippo, MD;  Location: [facility];  Service: Endoscopy;  Laterality: N/A;  7:30   INSERT / REPLACE / REMOVE PACEMAKER     JOINT REPLACEMENT Left 2017   REPLACEMENT TOTAL KNEE     left 2017   TRANSURETHRAL RESECTION OF BLADDER TUMOR N/A 10/26/2017   Procedure: TRANSURETHRAL RESECTION OF BLADDER TUMOR (TURBT);  Surgeon: Marcine Matar, MD;  Location: [facility];  Service: Urology;  Laterality: N/A;    Home Medications:  Allergies as of 03/23/2023   No Known Allergies      Medication List        Accurate as of March 21, 2023  8:34 AM. If you have any questions, ask your nurse or doctor.          acetaminophen 500 MG tablet Commonly known as: TYLENOL Take 500-1,000 mg by mouth every 6 (six) hours as needed for moderate pain or headache.   carvedilol 6.25 MG tablet Commonly known as: COREG Take 6.25 mg by mouth 2 (two) times daily with a meal.   famotidine 20 MG tablet Commonly known as: PEPCID Take 1 tablet (20 mg total) by mouth at bedtime.   insulin glargine 100 UNIT/ML injection Commonly known as: LANTUS Inject 60 Units into the skin at bedtime.   insulin  lispro 100 UNIT/ML injection Commonly known as: HUMALOG Inject 26 Units into the skin 3 (three) times daily with meals.   levocetirizine 5 MG tablet Commonly known as: XYZAL Take 5 mg by mouth daily.   levothyroxine 75 MCG tablet Commonly known as: SYNTHROID Take 75 mcg by mouth daily.   Magnesium 400 MG Tabs Take 400  mg by mouth daily.   rosuvastatin 20 MG tablet Commonly known as: CRESTOR Take 20 mg by mouth daily.   sacubitril-valsartan 97-103 MG Commonly known as: ENTRESTO Take 1 tablet by mouth 2 (two) times daily.   sitaGLIPtin 100 MG tablet Commonly known as: JANUVIA Take 100 mg by mouth daily.   sotalol 80 MG tablet Commonly known as: BETAPACE Take 80 mg by mouth 2 (two) times daily.   Stiolto Respimat 2.5-2.5 MCG/ACT Aers Generic drug: Tiotropium Bromide-Olodaterol Inhale 2 puffs into the lungs daily.   tamsulosin 0.4 MG Caps capsule Commonly known as: FLOMAX Take 1 capsule by mouth once daily        Allergies: No Known Allergies  Family History  Problem Relation Age of Onset   Stroke Mother    Colon polyps Neg Hx    Colon cancer Neg Hx     Social History:  reports that he quit smoking about 26 years ago. His smoking use included cigarettes. He started smoking about 56 years ago. He has a 75 pack-year smoking history. He has never used smokeless tobacco. He reports that he does not drink alcohol and does not use drugs.  ROS: A complete review of systems was performed.  All systems are negative except for pertinent findings as noted.  Physical Exam:  Vital signs in last 24 hours: There were no vitals taken for this visit. Constitutional:  Alert and oriented, No acute distress Cardiovascular: Regular rate  Respiratory: Normal respiratory effort Neurologic: Grossly intact, no focal deficits Psychiatric: Normal mood and affect  I have reviewed prior pt notes  I have reviewed cytology as well as pathology.  I have independently reviewed prior imaging-retrograde studies  Cystoscopy Procedure Note:  Indication: Bladder cancer surveillance  After informed consent and discussion of the procedure and its risks, Michael Key was positioned and prepped in the standard fashion.  Cystoscopy was performed with a flexible cystoscope.    Findings: Urethra:*** Prostate:*** Bladder neck:*** Ureteral orifices:*** Bladder:***  The patient tolerated the procedure well.      Impression/Assessment:  History of high-grade nonmuscle invasive bladder cancer.  Recent positive cytologies led to anesthetic cystoscopy, bladder biopsies and retrogrades.  All pathology/cytology/cystoscopy negative  Plan:

## 2023-03-23 ENCOUNTER — Other Ambulatory Visit: Payer: Medicare Other | Admitting: Urology

## 2023-03-23 DIAGNOSIS — Z8551 Personal history of malignant neoplasm of bladder: Secondary | ICD-10-CM

## 2023-05-04 ENCOUNTER — Other Ambulatory Visit: Admitting: Urology

## 2023-06-07 NOTE — Progress Notes (Signed)
 History of Present Illness: "Michael Key" returns for continued  follow-up of bladder cancer.  10.22.2019: TURBT/bladder biopsy. Pathology revealed carcinoma in situ.  2.25.2020: He completed 6 induction instillations of BCG on 1.3.2020. Cystoscopy c/w BCG reaction, cytology revealed atypia/inflamation.  3.24.2020: Completed 1st maintenance BCG (3 Rxs).  6.30.2020: Patient returns for follow up cystoscopy following two maintenances of BCG (third delayed due to COVID). Cysto nml.  8.4.2020: Completed 2nd BCG maintenance rx.  9.15.2020: Cystoscopy negative. 12.15.2020: Completed maintenance BCG #3 2.23.2021: Cystoscopy/cytology negative. 8.24.2021: Cysto/cytology negative 3.1.2022: Cysto negative. Cytology--atypical cells 3.7.2023: Cysto negative.  3.12.2024: Cysto negative, cytology positive.  4.18.2024: Underwent anesthetic cystoscopy, bladder barbotage, bilateral retrograde ureteropyelogram's, bladder biopsies.  Retrograde study is normal.  Bladder washings-cytology negative.  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  6.3.2025: Here for cystoscopy.  He was in the hospital in [place] about a month ago for weakness and presumed COVID.  He has had foul-smelling urine over the past 2 to 3 weeks.  Past Medical History:  Diagnosis Date   AICD (automatic cardioverter/defibrillator) present    Arthritis    Cancer (HCC)    Coronary artery disease    Diabetes (HCC) 06/04/2016   Dyspnea    Essential hypertension, benign 06/04/2016   Fatty liver    GERD (gastroesophageal reflux disease) 06/04/2016   High cholesterol    History of kidney stones    HOH (hard of hearing)    Kidney stones 06/04/2016   Myocardial infarction (HCC) 1999   Pneumonia    Presence of permanent cardiac pacemaker     Past Surgical History:  Procedure Laterality Date   ANAL FISSURE REPAIR  1968   BIOPSY  06/22/2016   Procedure: BIOPSY;  Surgeon: Ruby Corporal, MD;  [facility]: [facility];  Service: Endoscopy;;  gastric   CARDIAC DEFIBRILLATOR PLACEMENT     CORONARY ARTERY BYPASS GRAFT     04/2015   CYSTOSCOPY W/ RETROGRADES Bilateral 04/23/2022   Procedure: CYSTOSCOPY WITH RETROGRADE PYELOGRAM;  Surgeon: Trent Frizzle, MD;  Location: [facility];  Service: Urology;  Laterality: Bilateral;   CYSTOSCOPY WITH BIOPSY N/A 04/23/2022   Procedure: CYSTOSCOPY WITH BLADDER BIOPSY;  Surgeon: Trent Frizzle, MD;  Location: [facility];  Service: Urology;  Laterality: N/A;   ESOPHAGOGASTRODUODENOSCOPY N/A 06/22/2016   Procedure: ESOPHAGOGASTRODUODENOSCOPY (EGD);  Surgeon: Ruby Corporal, MD;  [facility];  Service: Endoscopy;  Laterality: N/A;  7:30   INSERT / REPLACE / REMOVE PACEMAKER     JOINT REPLACEMENT Left 2017   REPLACEMENT TOTAL KNEE     left 2017   TRANSURETHRAL RESECTION OF BLADDER TUMOR N/A 10/26/2017   Procedure: TRANSURETHRAL RESECTION OF BLADDER TUMOR (TURBT);  Surgeon: Trent Frizzle, MD;  Location: [facility];  Service: Urology;  Laterality: N/A;    Home Medications:  Allergies as of 06/08/2023   No Known Allergies      Medication List        Accurate as of June 07, 2023  8:43 AM. If you have any questions, ask your nurse or doctor.          acetaminophen  500 MG tablet Commonly known as: TYLENOL  Take 500-1,000 mg by mouth every 6 (six) hours as needed for moderate pain or headache.   carvedilol 6.25 MG tablet Commonly known as: COREG Take 6.25 mg by mouth 2 (two) times daily with a meal.   famotidine  20 MG tablet Commonly known as: PEPCID  Take 1 tablet (20 mg total) by mouth at bedtime.  insulin glargine 100 UNIT/ML injection Commonly known as: LANTUS Inject 60 Units into the skin at bedtime.   insulin lispro 100 UNIT/ML injection Commonly known as: HUMALOG Inject 26 Units into the skin 3 (three) times daily with meals.   levocetirizine 5 MG tablet Commonly known as: XYZAL Take 5 mg by mouth daily.   levothyroxine 75 MCG  tablet Commonly known as: SYNTHROID Take 75 mcg by mouth daily.   Magnesium 400 MG Tabs Take 400 mg by mouth daily.   rosuvastatin 20 MG tablet Commonly known as: CRESTOR Take 20 mg by mouth daily.   sacubitril-valsartan 97-103 MG Commonly known as: ENTRESTO Take 1 tablet by mouth 2 (two) times daily.   sitaGLIPtin 100 MG tablet Commonly known as: JANUVIA Take 100 mg by mouth daily.   sotalol 80 MG tablet Commonly known as: BETAPACE Take 80 mg by mouth 2 (two) times daily.   Stiolto Respimat  2.5-2.5 MCG/ACT Aers Generic drug: Tiotropium Bromide-Olodaterol Inhale 2 puffs into the lungs daily.   tamsulosin  0.4 MG Caps capsule Commonly known as: FLOMAX  Take 1 capsule by mouth once daily        Allergies: No Known Allergies  Family History  Problem Relation Age of Onset   Stroke Mother    Colon polyps Neg Hx    Colon cancer Neg Hx     Social History:  reports that he quit smoking about 26 years ago. His smoking use included cigarettes. He started smoking about 56 years ago. He has a 75 pack-year smoking history. He has never used smokeless tobacco. He reports that he does not drink alcohol and does not use drugs.  ROS: A complete review of systems was performed.  All systems are negative except for pertinent findings as noted.  Physical Exam:  Vital signs in last 24 hours: There were no vitals taken for this visit. Constitutional:  Alert and oriented, No acute distress Cardiovascular: Regular rate  Respiratory: Normal respiratory effort Neurologic: Grossly intact, no focal deficits Psychiatric: Normal mood and affect  I have reviewed prior pt notes  I have reviewed cytology as well as pathology.  I have independently reviewed prior imaging-retrograde studies  I catheterized the patient for urine specimen.  40 cc of urine obtained.  Cloudy in nature.  Urine looked infected, cystoscopy canceled     Impression/Assessment:  History of high-grade  nonmuscle invasive bladder cancer.  Recent positive cytologies led to anesthetic cystoscopy, bladder biopsies and retrogrades.  All pathology/cytology/cystoscopy negative  UTI Plan:  Cystoscopy canceled  Urine culture sent, he was placed on antibiotics  I will see him back in a month for repeat cystoscopy

## 2023-06-08 ENCOUNTER — Ambulatory Visit (INDEPENDENT_AMBULATORY_CARE_PROVIDER_SITE_OTHER): Admitting: Urology

## 2023-06-08 ENCOUNTER — Encounter: Payer: Self-pay | Admitting: Urology

## 2023-06-08 VITALS — BP 150/62 | HR 69

## 2023-06-08 DIAGNOSIS — Z8551 Personal history of malignant neoplasm of bladder: Secondary | ICD-10-CM

## 2023-06-08 DIAGNOSIS — R82998 Other abnormal findings in urine: Secondary | ICD-10-CM | POA: Diagnosis not present

## 2023-06-08 DIAGNOSIS — N3000 Acute cystitis without hematuria: Secondary | ICD-10-CM

## 2023-06-08 LAB — URINALYSIS, ROUTINE W REFLEX MICROSCOPIC
Bilirubin, UA: NEGATIVE
Glucose, UA: NEGATIVE
Ketones, UA: NEGATIVE
Nitrite, UA: POSITIVE — AB
Specific Gravity, UA: 1.02 (ref 1.005–1.030)
Urobilinogen, Ur: 0.2 mg/dL (ref 0.2–1.0)
pH, UA: 6 (ref 5.0–7.5)

## 2023-06-08 LAB — MICROSCOPIC EXAMINATION: WBC, UA: >30 /HPF — AB (ref 0–5)

## 2023-06-08 MED ORDER — CEPHALEXIN 500 MG PO CAPS
500.0000 mg | ORAL_CAPSULE | Freq: Two times a day (BID) | ORAL | 0 refills | Status: AC
Start: 2023-06-08 — End: ?

## 2023-06-08 MED ORDER — CIPROFLOXACIN HCL 500 MG PO TABS
500.0000 mg | ORAL_TABLET | Freq: Once | ORAL | Status: DC
Start: 2023-06-08 — End: 2023-06-08

## 2023-06-10 ENCOUNTER — Other Ambulatory Visit: Payer: Self-pay | Admitting: Urology

## 2023-06-13 LAB — URINE CULTURE

## 2023-06-14 ENCOUNTER — Other Ambulatory Visit: Payer: Self-pay | Admitting: Urology

## 2023-06-14 DIAGNOSIS — N3000 Acute cystitis without hematuria: Secondary | ICD-10-CM

## 2023-06-14 MED ORDER — AMOXICILLIN-POT CLAVULANATE 875-125 MG PO TABS
1.0000 | ORAL_TABLET | Freq: Two times a day (BID) | ORAL | 0 refills | Status: DC
Start: 2023-06-14 — End: 2023-07-27

## 2023-06-15 ENCOUNTER — Ambulatory Visit: Payer: Self-pay

## 2023-06-15 NOTE — Telephone Encounter (Signed)
 Spoke w/ pt wife in regards to pt ua/culture results per MD dahlstedt (see previous notes/encounter for details) pt wife voiced understanding confirmed pharmacy and stated she would pick up Rx later today

## 2023-06-15 NOTE — Telephone Encounter (Signed)
-----   Message from Malcolm Scrivener Dahlstedt sent at 06/14/2023  3:58 PM EDT ----- Let patient know that his bacteria were resistant to the cephalexin .  I have sent in a new antibiotic just now.Aaron Aas  He should take it for 5 days ----- Message ----- From: Interface, [facility] Lab Results In Sent: 06/08/2023   3:36 PM EDT To: Trent Frizzle, MD

## 2023-07-26 NOTE — Progress Notes (Signed)
 Impression/Assessment:  History of high-grade nonmuscle invasive bladder cancer.  Positive cytologies in 2024 led to anesthetic cystoscopy, bladder biopsies and retrogrades.  All pathology/cytology/cystoscopy negative.  Cystoscopy today negative   Plan:  - Voided urine sent for cytology  -Office visit for cystoscopy in 1 year   History of Present Illness: Michael Key returns for continued  follow-up of bladder cancer.  10.22.2019: TURBT/bladder biopsy. Pathology revealed carcinoma in situ.  2.25.2020: He completed 6 induction instillations of BCG on 1.3.2020. Cystoscopy c/w BCG reaction, cytology revealed atypia/inflamation.  3.24.2020: Completed 1st maintenance BCG (3 Rxs).  6.30.2020: Patient returns for follow up cystoscopy following two maintenances of BCG (third delayed due to COVID). Cysto nml.  8.4.2020: Completed 2nd BCG maintenance rx.  9.15.2020: Cystoscopy negative. 12.15.2020: Completed maintenance BCG #3 2.23.2021: Cystoscopy/cytology negative. 8.24.2021: Cysto/cytology negative 3.1.2022: Cysto negative. Cytology--atypical cells 3.7.2023: Cysto negative.  3.12.2024: Cysto negative, cytology positive.  4.18.2024: Underwent anesthetic cystoscopy, bladder barbotage, bilateral retrograde ureteropyelogram's, bladder biopsies.  Retrograde study is normal.  Bladder washings-cytology negative.  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  6.3.2025: Here for cystoscopy.  He was in the hospital in [place] about a month ago for weakness and presumed COVID.  He has had foul-smelling urine over the past 2 to 3 weeks.  Culture did grow 2 organisms-Klebsiella and ESBL E. coli.  Treated with Augmentin .  7.22.2025: Here for cystoscopy.  His urine has been clear.  No sign of infections.  Past Medical History:  Diagnosis Date   AICD (automatic cardioverter/defibrillator) present    Arthritis    Cancer (HCC)    Coronary artery disease    Diabetes (HCC)  06/04/2016   Dyspnea    Essential hypertension, benign 06/04/2016   Fatty liver    GERD (gastroesophageal reflux disease) 06/04/2016   High cholesterol    History of kidney stones    HOH (hard of hearing)    Kidney stones 06/04/2016   Myocardial infarction (HCC) 1999   Pneumonia    Presence of permanent cardiac pacemaker     Past Surgical History:  Procedure Laterality Date   ANAL FISSURE REPAIR  1968   BIOPSY  06/22/2016   Procedure: BIOPSY;  Surgeon: Golda Claudis PENNER, MD;  Location: [facility];  Service: Endoscopy;;  gastric   CARDIAC DEFIBRILLATOR PLACEMENT     CORONARY ARTERY BYPASS GRAFT     04/2015   CYSTOSCOPY W/ RETROGRADES Bilateral 04/23/2022   Procedure: CYSTOSCOPY WITH RETROGRADE PYELOGRAM;  Surgeon: Matilda Senior, MD;  Location: [facility];  Service: Urology;  Laterality: Bilateral;   CYSTOSCOPY WITH BIOPSY N/A 04/23/2022   Procedure: CYSTOSCOPY WITH BLADDER BIOPSY;  Surgeon: Matilda Senior, MD;  Location: [facility];  Service: Urology;  Laterality: N/A;   ESOPHAGOGASTRODUODENOSCOPY N/A 06/22/2016   Procedure: ESOPHAGOGASTRODUODENOSCOPY (EGD);  Surgeon: Golda Claudis PENNER, MD;  Location: [facility];  Service: Endoscopy;  Laterality: N/A;  7:30   INSERT / REPLACE / REMOVE PACEMAKER     JOINT REPLACEMENT Left 2017   REPLACEMENT TOTAL KNEE     left 2017   TRANSURETHRAL RESECTION OF BLADDER TUMOR N/A 10/26/2017   Procedure: TRANSURETHRAL RESECTION OF BLADDER TUMOR (TURBT);  Surgeon: Matilda Senior, MD;  Location: [facility];  Service: Urology;  Laterality: N/A;    Home Medications:  Allergies as of 07/27/2023   No Known Allergies      Medication List        Accurate as of July 26, 2023 11:49 AM. If you have any questions, ask your  nurse or doctor.          acetaminophen  500 MG tablet Commonly known as: TYLENOL  Take 500-1,000 mg by mouth every 6 (six) hours as needed for moderate pain or headache.   amoxicillin -clavulanate 875-125 MG tablet Commonly  known as: AUGMENTIN  Take 1 tablet by mouth every 12 (twelve) hours.   carvedilol 6.25 MG tablet Commonly known as: COREG Take 6.25 mg by mouth 2 (two) times daily with a meal.   cephALEXin  500 MG capsule Commonly known as: KEFLEX  Take 1 capsule (500 mg total) by mouth 2 (two) times daily.   famotidine  20 MG tablet Commonly known as: PEPCID  Take 1 tablet (20 mg total) by mouth at bedtime.   insulin glargine 100 UNIT/ML injection Commonly known as: LANTUS Inject 60 Units into the skin at bedtime.   insulin lispro 100 UNIT/ML injection Commonly known as: HUMALOG Inject 26 Units into the skin 3 (three) times daily with meals.   levocetirizine 5 MG tablet Commonly known as: XYZAL Take 5 mg by mouth daily.   levothyroxine 75 MCG tablet Commonly known as: SYNTHROID Take 75 mcg by mouth daily.   Magnesium 400 MG Tabs Take 400 mg by mouth daily.   rosuvastatin 20 MG tablet Commonly known as: CRESTOR Take 20 mg by mouth daily.   sacubitril-valsartan 97-103 MG Commonly known as: ENTRESTO Take 1 tablet by mouth 2 (two) times daily.   sitaGLIPtin 100 MG tablet Commonly known as: JANUVIA Take 100 mg by mouth daily.   sotalol 80 MG tablet Commonly known as: BETAPACE Take 80 mg by mouth 2 (two) times daily.   Stiolto Respimat  2.5-2.5 MCG/ACT Aers Generic drug: Tiotropium Bromide-Olodaterol Inhale 2 puffs into the lungs daily.   tamsulosin  0.4 MG Caps capsule Commonly known as: FLOMAX  Take 1 capsule by mouth once daily        Allergies: No Known Allergies  Family History  Problem Relation Age of Onset   Stroke Mother    Colon polyps Neg Hx    Colon cancer Neg Hx     Social History:  reports that he quit smoking about 26 years ago. His smoking use included cigarettes. He started smoking about 56 years ago. He has a 75 pack-year smoking history. He has never used smokeless tobacco. He reports that he does not drink alcohol and does not use drugs.  ROS: A  complete review of systems was performed.  All systems are negative except for pertinent findings as noted.  Physical Exam:  Vital signs in last 24 hours: There were no vitals taken for this visit. Constitutional:  Alert and oriented, No acute distress Cardiovascular: Regular rate  Respiratory: Normal respiratory effort Neurologic: Grossly intact, no focal deficits Psychiatric: Normal mood and affect  I have reviewed prior pt notes  I have reviewed cytology as well as pathology.  I have independently reviewed prior imaging-retrograde studies   Cystoscopy Procedure Note:  Indication: History of bladder cancer  After informed consent and discussion of the procedure and its risks, Michael Key was positioned and prepped in the standard fashion.  Cystoscopy was performed with a flexible cystoscope.   Findings: Urethra: Mild narrowing, no stricture.  No lesion. Prostate: Bilobar hypertrophy with obstruction Bladder neck: Open Ureteral orifices: Normally placed and configured bilaterally. Bladder: Minimal trabeculations.  No urothelial lesions.  No foreign bodies.  The patient tolerated the procedure well.

## 2023-07-27 ENCOUNTER — Encounter: Payer: Self-pay | Admitting: Urology

## 2023-07-27 ENCOUNTER — Ambulatory Visit (INDEPENDENT_AMBULATORY_CARE_PROVIDER_SITE_OTHER): Admitting: Urology

## 2023-07-27 VITALS — BP 147/65 | HR 67

## 2023-07-27 DIAGNOSIS — N3000 Acute cystitis without hematuria: Secondary | ICD-10-CM

## 2023-07-27 DIAGNOSIS — Z08 Encounter for follow-up examination after completed treatment for malignant neoplasm: Secondary | ICD-10-CM

## 2023-07-27 DIAGNOSIS — Z8551 Personal history of malignant neoplasm of bladder: Secondary | ICD-10-CM | POA: Diagnosis not present

## 2023-07-27 MED ORDER — CIPROFLOXACIN HCL 500 MG PO TABS
500.0000 mg | ORAL_TABLET | Freq: Once | ORAL | Status: AC
Start: 2023-07-27 — End: 2023-07-27
  Administered 2023-07-27: 500 mg via ORAL

## 2023-07-28 LAB — CYTOLOGY, URINE

## 2023-07-28 LAB — URINALYSIS, ROUTINE W REFLEX MICROSCOPIC
Bilirubin, UA: NEGATIVE
Ketones, UA: NEGATIVE
Leukocytes,UA: NEGATIVE
Nitrite, UA: NEGATIVE
Protein,UA: NEGATIVE
RBC, UA: NEGATIVE
Specific Gravity, UA: 1.015 (ref 1.005–1.030)
Urobilinogen, Ur: 0.2 mg/dL (ref 0.2–1.0)
pH, UA: 6 (ref 5.0–7.5)

## 2023-07-30 ENCOUNTER — Ambulatory Visit: Payer: Self-pay

## 2023-07-30 NOTE — Telephone Encounter (Signed)
 Tried calling pt with no answer, left detail message making patient aware of Dr. Matilda response.

## 2023-07-30 NOTE — Telephone Encounter (Signed)
-----   Message from Garnette HERO Dahlstedt sent at 07/30/2023  8:10 AM EDT ----- Please call-good news urine cytology shows no cancer cells ----- Message ----- From: Rebecka Memos Lab Results In Sent: 07/28/2023   5:37 AM EDT To: Garnette Shack, MD

## 2023-11-08 ENCOUNTER — Encounter: Payer: Self-pay | Admitting: Diagnostic Neuroimaging

## 2023-11-08 ENCOUNTER — Ambulatory Visit: Admitting: Diagnostic Neuroimaging

## 2024-01-21 ENCOUNTER — Telehealth: Payer: Self-pay

## 2024-01-21 NOTE — Telephone Encounter (Addendum)
 Return call to patient. Pt's wife state's pt has been having incontinent for a while now and pt/wife is concern and wants appointment. Pt/wife was offer an appointment today but decline due to at [facility] shop and lives in [place].  Pt/wife is made aware a message will be sent to Dr. Matilda on how to proceed. Pt/wife voiced understanding

## 2024-01-24 NOTE — Telephone Encounter (Signed)
 Return call to pt/wife. Appointment made and scheduled with pt's wife.

## 2024-02-07 ENCOUNTER — Ambulatory Visit

## 2024-02-07 ENCOUNTER — Ambulatory Visit: Admitting: Urology

## 2024-02-07 NOTE — Telephone Encounter (Signed)
 I called patient to r/s his appt.  Patient was scheduled for a yearly follow up with Dr. Sherrilee for bladder cancer surveillance and scheduled with Dr. Matilda for worsening incontinence/ incomplete emptying of the bladder symptoms.  No available appts at the [facility] office.  Patients wife was advised for pt to go to the ER if symptoms worsen or he is unable to void.  His wife voiced understanding.

## 2024-02-22 ENCOUNTER — Ambulatory Visit: Admitting: Urology

## 2024-06-12 ENCOUNTER — Ambulatory Visit: Admitting: Urology

## 2024-08-01 ENCOUNTER — Other Ambulatory Visit: Admitting: Urology

## 2024-08-04 ENCOUNTER — Other Ambulatory Visit: Admitting: Urology
